# Patient Record
Sex: Female | Born: 1980 | Race: White | Hispanic: No | Marital: Married | Smoking: Never smoker
Health system: Southern US, Community
[De-identification: ages and names within clinical notes are randomized; demographics above are authoritative.]

## PROBLEM LIST (undated history)

## (undated) DIAGNOSIS — D509 Iron deficiency anemia, unspecified: Principal | ICD-10-CM

## (undated) DIAGNOSIS — D649 Anemia, unspecified: Secondary | ICD-10-CM

## (undated) DIAGNOSIS — Z9884 Bariatric surgery status: Secondary | ICD-10-CM

## (undated) DIAGNOSIS — E559 Vitamin D deficiency, unspecified: Secondary | ICD-10-CM

## (undated) DIAGNOSIS — Z5189 Encounter for other specified aftercare: Secondary | ICD-10-CM

## (undated) HISTORY — PX: GASTRIC BYPASS: SHX52

## (undated) HISTORY — DX: Vitamin D deficiency, unspecified: E55.9

## (undated) HISTORY — PX: APPENDECTOMY: SHX54

## (undated) HISTORY — DX: Anemia, unspecified: D64.9

## (undated) HISTORY — DX: Bariatric surgery status: Z98.84

## (undated) HISTORY — PX: ABDOMINAL HYSTERECTOMY: SHX81

## (undated) HISTORY — DX: Iron deficiency anemia, unspecified: D50.9

## (undated) HISTORY — DX: Encounter for other specified aftercare: Z51.89

## (undated) HISTORY — PX: CHOLECYSTECTOMY: SHX55

---

## 2013-07-23 ENCOUNTER — Ambulatory Visit (INDEPENDENT_AMBULATORY_CARE_PROVIDER_SITE_OTHER): Payer: BC Managed Care – PPO | Admitting: Family Medicine

## 2013-07-23 VITALS — BP 118/72 | HR 65 | Temp 97.8°F | Resp 16 | Ht 66.0 in | Wt 145.0 lb

## 2013-07-23 DIAGNOSIS — R5381 Other malaise: Secondary | ICD-10-CM

## 2013-07-23 DIAGNOSIS — R252 Cramp and spasm: Secondary | ICD-10-CM

## 2013-07-23 DIAGNOSIS — R5383 Other fatigue: Secondary | ICD-10-CM

## 2013-07-23 DIAGNOSIS — R209 Unspecified disturbances of skin sensation: Secondary | ICD-10-CM

## 2013-07-23 DIAGNOSIS — Z862 Personal history of diseases of the blood and blood-forming organs and certain disorders involving the immune mechanism: Secondary | ICD-10-CM

## 2013-07-23 LAB — CBC WITH DIFFERENTIAL/PLATELET
Basophils Absolute: 0 10*3/uL (ref 0.0–0.1)
Eosinophils Absolute: 0 10*3/uL (ref 0.0–0.7)
HCT: 39 % (ref 36.0–46.0)
Hemoglobin: 12.1 g/dL (ref 12.0–15.0)
Lymphocytes Relative: 25 % (ref 12–46)
Lymphs Abs: 2.1 10*3/uL (ref 0.7–4.0)
MCH: 25.9 pg — ABNORMAL LOW (ref 26.0–34.0)
MCHC: 31.1 g/dL (ref 30.0–36.0)
MCV: 83.6 fL (ref 78.0–100.0)
Monocytes Absolute: 0.4 10*3/uL (ref 0.1–1.0)
Monocytes Relative: 5 % (ref 3–12)
Neutro Abs: 5.7 10*3/uL (ref 1.7–7.7)
Neutrophils Relative %: 70 % (ref 43–77)
Platelets: 246 10*3/uL (ref 150–400)
RBC: 4.67 MIL/uL (ref 3.87–5.11)
RDW: 15.1 % (ref 11.5–15.5)
WBC: 8.2 10*3/uL (ref 4.0–10.5)

## 2013-07-23 MED ORDER — OXYCODONE HCL 5 MG PO TABS: 5.0000 mg | ORAL_TABLET | Freq: Four times a day (QID) | ORAL | Status: DC | PRN

## 2013-07-23 NOTE — Progress Notes (Signed)
   Subjective:    Patient ID: Cindy Hodge, female    DOB: 06/23/1980, 33 y.o.   MRN: [id number]  HPI  33 year old female presents for evaluation of anemia. Hx of anemia in pregnancy diagnosed at prenatal visit with a Hgb of 3.8.     itchiness in feet Fatigue Color decreasing, lethargic  Was taking iron supplements for 2 months that did not help. Had infusions for the remainder 3 months postpartem supposed to f/u with hematology Heavy period postpartem On menses now    Review of Systems  Constitutional: Positive for fatigue. Negative for fever and chills.  Cardiovascular: Negative for chest pain.  Gastrointestinal: Negative for nausea and vomiting.  Musculoskeletal: Negative for myalgias (+muscle cramps).  Neurological: Positive for weakness.       Objective:   Physical Exam  Constitutional: She is oriented to person, place, and time. She appears well-developed and well-nourished.  HENT:  Head: Normocephalic and atraumatic.  Right Ear: External ear normal.  Left Ear: External ear normal.  Eyes: Conjunctivae are normal.  Neck: Normal range of motion. Neck supple.  Cardiovascular: Normal rate, regular rhythm and normal heart sounds.   Pulmonary/Chest: Effort normal and breath sounds normal.  Abdominal: There is no hepatosplenomegaly. There is no tenderness. There is no rigidity and no guarding.  Lymphadenopathy:    She has no cervical adenopathy.  Neurological: She is alert and oriented to person, place, and time.  Psychiatric: She has a normal mood and affect. Her behavior is normal. Judgment and thought content normal.      Results for orders placed in visit on 07/23/13  CBC WITH DIFFERENTIAL      Result Value Ref Range   WBC 8.2  4.0 - 10.5 K/uL   RBC 4.67  3.87 - 5.11 MIL/uL   Hemoglobin 12.1  12.0 - 15.0 g/dL   HCT 39.0  36.0 - 46.0 %   MCV 83.6  78.0 - 100.0 fL   MCH 25.9 (*) 26.0 - 34.0 pg   MCHC 31.1  30.0 - 36.0 g/dL   RDW 15.1  11.5 - 15.5 %   Platelets 246  150 - 400 K/uL   Neutrophils Relative % 70  43 - 77 %   Neutro Abs 5.7  1.7 - 7.7 K/uL   Lymphocytes Relative 25  12 - 46 %   Lymphs Abs 2.1  0.7 - 4.0 K/uL   Monocytes Relative 5  3 - 12 %   Monocytes Absolute 0.4  0.1 - 1.0 K/uL   Eosinophils Absolute 0.0  0.0 - 0.7 K/uL   Basophils Absolute 0.0  0.0 - 0.1 K/uL   Smear Review Criteria for review not met         Assessment & Plan:  History of anemia - Plan: CBC with Differential, Ferritin, Iron and TIBC, Ambulatory referral to Hematology  Other fatigue  Disturbance of skin sensation  Muscle cramps - Plan: oxyCODONE (OXY IR/ROXICODONE) 5 MG immediate release tablet  Stat CBC normal which is reassuring. Iron studies pending. Will add TSH and CMET if possible.  Small quantity of oxycodone 5 mg tablets provided for pain/cramping. Per patient she cannot tolerate acetaminophen. RTC precautions discussed. Proceed with hematology evaluation. Follow up will be based on remainder of labs.

## 2013-07-24 LAB — COMPREHENSIVE METABOLIC PANEL
ALT: 58 U/L — ABNORMAL HIGH (ref 0–35)
AST: 46 U/L — ABNORMAL HIGH (ref 0–37)
Albumin: 4.5 g/dL (ref 3.5–5.2)
Alkaline Phosphatase: 103 U/L (ref 39–117)
BUN: 12 mg/dL (ref 6–23)
CO2: 20 mEq/L (ref 19–32)
Calcium: 9 mg/dL (ref 8.4–10.5)
Chloride: 107 mEq/L (ref 96–112)
Creat: 0.61 mg/dL (ref 0.50–1.10)
Glucose, Bld: 82 mg/dL (ref 70–99)
Potassium: 4.1 mEq/L (ref 3.5–5.3)
Sodium: 139 mEq/L (ref 135–145)
Total Bilirubin: 0.4 mg/dL (ref 0.2–1.2)
Total Protein: 7 g/dL (ref 6.0–8.3)

## 2013-07-24 LAB — IRON AND TIBC
%SAT: 7 % — ABNORMAL LOW (ref 20–55)
Iron: 33 ug/dL — ABNORMAL LOW (ref 42–145)
TIBC: 445 ug/dL (ref 250–470)
UIBC: 412 ug/dL — ABNORMAL HIGH (ref 125–400)

## 2013-07-24 LAB — TSH: TSH: 1.395 u[IU]/mL (ref 0.350–4.500)

## 2013-07-24 LAB — FERRITIN: Ferritin: 5 ng/mL — ABNORMAL LOW (ref 10–291)

## 2013-07-28 ENCOUNTER — Telehealth: Payer: Self-pay | Admitting: Hematology and Oncology

## 2013-07-28 NOTE — Telephone Encounter (Signed)
PATIENT CALLED TO CONFIRMED NP APPT ON 07/14 @ 1:45.  REFERRING DR. HEATHER MARTE DX- HX OF ANEMIA

## 2013-08-05 ENCOUNTER — Telehealth: Payer: Self-pay | Admitting: Hematology and Oncology

## 2013-08-05 ENCOUNTER — Ambulatory Visit (HOSPITAL_BASED_OUTPATIENT_CLINIC_OR_DEPARTMENT_OTHER): Payer: BC Managed Care – PPO | Admitting: Hematology and Oncology

## 2013-08-05 ENCOUNTER — Encounter: Payer: Self-pay | Admitting: Hematology and Oncology

## 2013-08-05 ENCOUNTER — Ambulatory Visit (HOSPITAL_BASED_OUTPATIENT_CLINIC_OR_DEPARTMENT_OTHER): Payer: BC Managed Care – PPO

## 2013-08-05 VITALS — BP 131/68 | HR 61 | Temp 98.4°F | Resp 18 | Ht 66.0 in | Wt 144.2 lb

## 2013-08-05 DIAGNOSIS — E559 Vitamin D deficiency, unspecified: Secondary | ICD-10-CM

## 2013-08-05 DIAGNOSIS — D509 Iron deficiency anemia, unspecified: Secondary | ICD-10-CM

## 2013-08-05 DIAGNOSIS — Z9884 Bariatric surgery status: Secondary | ICD-10-CM

## 2013-08-05 HISTORY — DX: Vitamin D deficiency, unspecified: E55.9

## 2013-08-05 HISTORY — DX: Bariatric surgery status: Z98.84

## 2013-08-05 HISTORY — DX: Iron deficiency anemia, unspecified: D50.9

## 2013-08-05 LAB — CBC & DIFF AND RETIC
BASO%: 0.4 % (ref 0.0–2.0)
BASOS ABS: 0 10*3/uL (ref 0.0–0.1)
EOS ABS: 0.1 10*3/uL (ref 0.0–0.5)
EOS%: 1.1 % (ref 0.0–7.0)
HCT: 39.3 % (ref 34.8–46.6)
HEMOGLOBIN: 12.4 g/dL (ref 11.6–15.9)
IMMATURE RETIC FRACT: 9.1 % (ref 1.60–10.00)
LYMPH%: 37.8 % (ref 14.0–49.7)
MCH: 26.2 pg (ref 25.1–34.0)
MCHC: 31.6 g/dL (ref 31.5–36.0)
MCV: 82.9 fL (ref 79.5–101.0)
MONO#: 0.3 10*3/uL (ref 0.1–0.9)
MONO%: 6.7 % (ref 0.0–14.0)
NEUT%: 54 % (ref 38.4–76.8)
NEUTROS ABS: 2.6 10*3/uL (ref 1.5–6.5)
Platelets: 284 10*3/uL (ref 145–400)
RBC: 4.74 10*6/uL (ref 3.70–5.45)
RDW: 14.8 % — AB (ref 11.2–14.5)
RETIC %: 0.78 % (ref 0.70–2.10)
RETIC CT ABS: 36.97 10*3/uL (ref 33.70–90.70)
WBC: 4.8 10*3/uL (ref 3.9–10.3)
lymph#: 1.8 10*3/uL (ref 0.9–3.3)

## 2013-08-05 LAB — IRON AND TIBC CHCC
%SAT: 30 % (ref 21–57)
IRON: 118 ug/dL (ref 41–142)
TIBC: 398 ug/dL (ref 236–444)
UIBC: 280 ug/dL (ref 120–384)

## 2013-08-05 LAB — FERRITIN CHCC: FERRITIN: 8 ng/mL — AB (ref 9–269)

## 2013-08-05 NOTE — Progress Notes (Signed)
[facility] CONSULT NOTE  Patient Care Team: No Pcp Per Patient as PCP - General (General Practice)  CHIEF COMPLAINTS/PURPOSE OF CONSULTATION:  Severe iron deficiency anemia  HISTORY OF PRESENTING ILLNESS:  Cindy Hodge 33 y.o. female is here because of recent diagnosis of severe iron deficiency anemia.  She was found to have abnormal CBC the complaining of shortness of breath. She was pregnant at that time with her first shot was found to have a hemoglobin of 3.8. He received somewhere between 4-6 units of blood to support her through pregnancy. The patient history of gastric bypass in 2003 and had lost 150 pounds. She was pregnant last year for the first time and was given blood transfusion. She nursed her baby for one month. Her son is currently 6 months ago. She was a regular blood donor when she was younger. The patient had a history of chronic menorrhagia in the past.  She denies recent chest pain on exertion, shortness of breath on minimal exertion, pre-syncopal episodes, or palpitations. She complained of profound fatigue. She had not noticed any recent bleeding such as epistaxis, hematuria or hematochezia The patient denies over the counter NSAID ingestion. She is not on antiplatelets agents.  She had no prior history or diagnosis of cancer. Her age appropriate screening programs are up-to-date. She complain of excessive pica with chewing ice. She eats a variety of diet.  The patient was prescribed oral iron supplements and she takes one daily.  MEDICAL HISTORY:  Past Medical History  Diagnosis Date  . Anemia   . Blood transfusion without reported diagnosis   . Iron deficiency anemia, unspecified 08/05/2013  . 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 Unspecified vitamin D deficiency 08/05/2013    SURGICAL HISTORY: Past Surgical History  Procedure Laterality Date  . Appendectomy    . Cholecystectomy    . Gastric bypass      SOCIAL HISTORY: History    Social History  . Marital Status: Married    Spouse Name: N/A    Number of Children: N/A  . Years of Education: N/A   Occupational History  . Not on file.   Social History Main Topics  . Smoking status: Never Smoker   . Smokeless tobacco: Not on file  . Alcohol Use: No  . Drug Use: Not on file  . Sexual Activity: Not on file   Other Topics Concern  . Not on file   Social History Narrative  . No narrative on file    FAMILY HISTORY: No family history on file.  ALLERGIES:  is allergic to acetaminophen.  MEDICATIONS:  Current Outpatient Prescriptions  Medication Sig Dispense Refill  . Ferrous Sulfate (IRON) 325 (65 FE) MG TABS Take by mouth daily.      . Multiple Vitamin (MULTIVITAMIN) capsule Take 1 capsule by mouth daily.      Marland Kitchen oxyCODONE (OXY IR/ROXICODONE) 5 MG immediate release tablet Take 1 tablet (5 mg total) by mouth every 6 (six) hours as needed for severe pain.  20 tablet  0   No current facility-administered medications for this visit.    REVIEW OF SYSTEMS:   Constitutional: Denies fevers, chills or abnormal night sweats Eyes: Denies blurriness of vision, double vision or watery eyes Ears, nose, mouth, throat, and face: Denies mucositis or sore throat Respiratory: Denies cough, dyspnea or wheezes Cardiovascular: Denies palpitation, chest discomfort or lower extremity swelling Gastrointestinal:  Denies nausea, heartburn or change in bowel habits Skin: Denies abnormal skin rashes Lymphatics: Denies  new lymphadenopathy or easy bruising Neurological:Denies numbness, tingling or new weaknesses Behavioral/Psych: Mood is stable, no new changes  All other systems were reviewed with the patient and are negative.  PHYSICAL EXAMINATION: ECOG PERFORMANCE STATUS: 0 - Asymptomatic  Filed Vitals:   08/05/13 1008  BP: 131/68  Pulse: 61  Temp: 98.4 F (36.9 C)  Resp: 18   Filed Weights   08/05/13 1008  Weight: 144 lb 3.2 oz (65.409 kg)    GENERAL:alert, no  distress and comfortable SKIN: skin color, texture, turgor are normal, no rashes or significant lesions EYES: normal, conjunctiva are pink and non-injected, sclera clear OROPHARYNX:no exudate, no erythema and lips, buccal mucosa, and tongue normal  NECK: supple, thyroid normal size, non-tender, without nodularity LYMPH:  no palpable lymphadenopathy in the cervical, axillary or inguinal LUNGS: clear to auscultation and percussion with normal breathing effort HEART: regular rate & rhythm and no murmurs and no lower extremity edema ABDOMEN:abdomen soft, non-tender and normal bowel sounds Musculoskeletal:no cyanosis of digits and no clubbing  PSYCH: alert & oriented x 3 with fluent speech NEURO: no focal motor/sensory deficits  LABORATORY DATA:  I have reviewed the data as listed Recent Results (from the past 2160 hour(s))  CBC WITH DIFFERENTIAL     Status: Abnormal   Collection Time    07/23/13  3:23 PM      Result Value Ref Range   WBC 8.2  4.0 - 10.5 K/uL   RBC 4.67  3.87 - 5.11 MIL/uL   Hemoglobin 12.1  12.0 - 15.0 g/dL   HCT 39.0  36.0 - 46.0 %   MCV 83.6  78.0 - 100.0 fL   MCH 25.9 (*) 26.0 - 34.0 pg   MCHC 31.1  30.0 - 36.0 g/dL   RDW 15.1  11.5 - 15.5 %   Platelets 246  150 - 400 K/uL   Neutrophils Relative % 70  43 - 77 %   Neutro Abs 5.7  1.7 - 7.7 K/uL   Lymphocytes Relative 25  12 - 46 %   Lymphs Abs 2.1  0.7 - 4.0 K/uL   Monocytes Relative 5  3 - 12 %   Monocytes Absolute 0.4  0.1 - 1.0 K/uL   Eosinophils Absolute 0.0  0.0 - 0.7 K/uL   Basophils Absolute 0.0  0.0 - 0.1 K/uL   Smear Review Criteria for review not met    FERRITIN     Status: Abnormal   Collection Time    07/23/13  3:24 PM      Result Value Ref Range   Ferritin 5 (*) 10 - 291 ng/mL  IRON AND TIBC     Status: Abnormal   Collection Time    07/23/13  3:24 PM      Result Value Ref Range   Iron 33 (*) 42 - 145 ug/dL   UIBC 412 (*) 125 - 400 ug/dL   TIBC 445  250 - 470 ug/dL   %SAT 7 (*) 20 - 55 %   COMPREHENSIVE METABOLIC PANEL     Status: Abnormal   Collection Time    07/23/13  6:01 PM      Result Value Ref Range   Sodium 139  135 - 145 mEq/L   Potassium 4.1  3.5 - 5.3 mEq/L   Chloride 107  96 - 112 mEq/L   CO2 20  19 - 32 mEq/L   Glucose, Bld 82  70 - 99 mg/dL   BUN 12  6 - 23 mg/dL  Creat 0.61  0.50 - 1.10 mg/dL   Total Bilirubin 0.4  0.2 - 1.2 mg/dL   Alkaline Phosphatase 103  39 - 117 U/L   AST 46 (*) 0 - 37 U/L   ALT 58 (*) 0 - 35 U/L   Total Protein 7.0  6.0 - 8.3 g/dL   Albumin 4.5  3.5 - 5.2 g/dL   Calcium 9.0  8.4 - 10.5 mg/dL  TSH     Status: None   Collection Time    07/23/13  6:01 PM      Result Value Ref Range   TSH 1.395  0.350 - 4.500 uIU/mL  CBC & DIFF AND RETIC     Status: Abnormal   Collection Time    08/05/13 10:47 AM      Result Value Ref Range   WBC 4.8  3.9 - 10.3 10e3/uL   NEUT# 2.6  1.5 - 6.5 10e3/uL   HGB 12.4  11.6 - 15.9 g/dL   HCT 39.3  34.8 - 46.6 %   Platelets 284  145 - 400 10e3/uL   MCV 82.9  79.5 - 101.0 fL   MCH 26.2  25.1 - 34.0 pg   MCHC 31.6  31.5 - 36.0 g/dL   RBC 4.74  3.70 - 5.45 10e6/uL   RDW 14.8 (*) 11.2 - 14.5 %   lymph# 1.8  0.9 - 3.3 10e3/uL   MONO# 0.3  0.1 - 0.9 10e3/uL   Eosinophils Absolute 0.1  0.0 - 0.5 10e3/uL   Basophils Absolute 0.0  0.0 - 0.1 10e3/uL   NEUT% 54.0  38.4 - 76.8 %   LYMPH% 37.8  14.0 - 49.7 %   MONO% 6.7  0.0 - 14.0 %   EOS% 1.1  0.0 - 7.0 %   BASO% 0.4  0.0 - 2.0 %   Retic % 0.78  0.70 - 2.10 %   Retic Ct Abs 36.97  33.70 - 90.70 10e3/uL   Immature Retic Fract 9.10  1.60 - 10.00 %  IRON AND TIBC [facility]     Status: None   Collection Time    08/05/13 10:47 AM      Result Value Ref Range   Iron 118  41 - 142 ug/dL   TIBC 398  236 - 444 ug/dL   UIBC 280  120 - 384 ug/dL   %SAT 30  21 - 57 %  FERRITIN [facility]     Status: Abnormal   Collection Time    08/05/13 10:47 AM      Result Value Ref Range   Ferritin 8 (*) 9 - 269 ng/ml  VITAMIN B12     Status: None   Collection Time     08/05/13 10:48 AM      Result Value Ref Range   Vitamin B-12 715  211 - 911 pg/mL   ASSESSMENT & PLAN:  Iron deficiency anemia, unspecified There is no doubt this could be related to severe mineral malabsorption. I recommend intravenous iron infusion if repeat blood work suggests severe iron deficiency anemia. In the meantime, she continues taking oral iron supplements. I will check also serum vitamin B12 level due do possible malabsorption of vitamin B12.  Unspecified vitamin D deficiency I recommend she takes 1000 units of vitamin D daily due to potential malabsorption of vitamin D. I will check a vitamin D level and if this very low, I prescribed high dose replacement therapy.      All questions were answered. The patient knows  to call the clinic with any problems, questions or concerns. I spent 30 mins counseling the patient face to face. The total time spent in the appointment was 45 mins and more than 50% was on counseling.     [facility] [place], [place], MD 08/05/2013 9:28 PM

## 2013-08-05 NOTE — Telephone Encounter (Signed)
gv adn printed aptp sched adna vs for pt for Jan 2016....sent pt to lab

## 2013-08-05 NOTE — Assessment & Plan Note (Addendum)
There is no doubt this could be related to severe mineral malabsorption. I recommend intravenous iron infusion if repeat blood work suggests severe iron deficiency anemia. In the meantime, she continues taking oral iron supplements. I will check also serum vitamin B12 level due do possible malabsorption of vitamin B12.

## 2013-08-05 NOTE — Telephone Encounter (Signed)
gv and printed appt sched and avs for pt for Jan 2016...sent pt to lab °

## 2013-08-05 NOTE — Assessment & Plan Note (Signed)
I recommend she takes 1000 units of vitamin D daily due to potential malabsorption of vitamin D. I will check a vitamin D level and if this very low, I prescribed high dose replacement therapy.

## 2013-08-06 ENCOUNTER — Telehealth: Payer: Self-pay | Admitting: Hematology and Oncology

## 2013-08-06 LAB — VITAMIN B12: Vitamin B-12: 715 pg/mL (ref 211–911)

## 2013-08-06 LAB — VITAMIN D 25 HYDROXY (VIT D DEFICIENCY, FRACTURES): Vit D, 25-Hydroxy: 59 ng/mL (ref 30–89)

## 2013-08-06 NOTE — Telephone Encounter (Signed)
I reviewed her blood test results. Her iron studies have improved, suggested that she is absorbing the iron supplement. Serum vitamin D and vitamin B 12 are within normal limits. The patient does not need intravenous iron. I recommend she continues oral iron supplement and recheck in 6 months.

## 2013-11-18 ENCOUNTER — Encounter: Payer: Self-pay | Admitting: Family Medicine

## 2013-11-18 ENCOUNTER — Ambulatory Visit (INDEPENDENT_AMBULATORY_CARE_PROVIDER_SITE_OTHER): Payer: BC Managed Care – PPO | Admitting: Family Medicine

## 2013-11-18 VITALS — BP 121/71 | HR 72 | Temp 98.1°F | Resp 16 | Ht 66.5 in | Wt 146.0 lb

## 2013-11-18 DIAGNOSIS — R350 Frequency of micturition: Secondary | ICD-10-CM

## 2013-11-18 DIAGNOSIS — J069 Acute upper respiratory infection, unspecified: Secondary | ICD-10-CM

## 2013-11-18 DIAGNOSIS — O2311 Infections of bladder in pregnancy, first trimester: Secondary | ICD-10-CM

## 2013-11-18 LAB — POCT URINALYSIS DIPSTICK
Bilirubin, UA: NEGATIVE
GLUCOSE UA: NEGATIVE
Leukocytes, UA: NEGATIVE
Nitrite, UA: POSITIVE
Protein, UA: 30
SPEC GRAV UA: 1.015
Urobilinogen, UA: 0.2
pH, UA: 6

## 2013-11-18 LAB — POCT UA - MICROSCOPIC ONLY
Casts, Ur, LPF, POC: NEGATIVE
Crystals, Ur, HPF, POC: NEGATIVE
MUCUS UA: POSITIVE
Yeast, UA: NEGATIVE

## 2013-11-18 MED ORDER — AMOXICILLIN 875 MG PO TABS: 875.0000 mg | ORAL_TABLET | Freq: Two times a day (BID) | ORAL | Status: DC

## 2013-11-18 MED ORDER — NITROFURANTOIN MONOHYD MACRO 100 MG PO CAPS: 100.0000 mg | ORAL_CAPSULE | Freq: Two times a day (BID) | ORAL | Status: DC

## 2013-11-18 MED ORDER — ALBUTEROL SULFATE HFA 108 (90 BASE) MCG/ACT IN AERS: 2.0000 | INHALATION_SPRAY | RESPIRATORY_TRACT | Status: AC | PRN

## 2013-11-18 NOTE — Patient Instructions (Addendum)
Increase fluids Mucinex to thin secretions  Medicines During Pregnancy During pregnancy, there are medicines that are either safe or unsafe to take. Medicines include prescriptions from your caregiver, over-the-counter medicines, topical creams applied to the skin, and all herbal substances. Medicines are put into either Class A, B, C, or D. Class A and B medicines have been shown to be safe in pregnancy. Class C medicines are also considered to be safe in pregnancy, but these medicines should only be used when necessary. Class D medicines should not be used at all in pregnancy. They can be harmful to a baby.  It is best to take as little medicine as possible while pregnant. However, some medicines are necessary to take for the mother and baby's health. Sometimes, it is more dangerous to stop taking certain medicines than to stay on them. This is often the case for people with long-term (chronic) conditions such as asthma, diabetes, or high blood pressure (hypertension). If you are pregnant and have a chronic illness, call your caregiver right away. Bring a list of your medicines and their doses to your appointments. If you are planning to become pregnant, schedule a doctor's appointment and discuss your medicines with your caregiver. Lastly, write down the phone number to your pharmacist. They can answer questions regarding a medicine's class and safety. They cannot give advice as to whether you should or should not be on a medicine.  SAFE AND UNSAFE MEDICINES There is a long list of medicines that are considered safe in pregnancy. Below is a shorter list. For specific medicines, ask your caregiver.  AllergyMedicines Loratadine, cetirizine, and chlorpheniramine are safe to take. Certain nasal steroid sprays are safe. Talk to your caregiver about specific brands that are safe. Analgesics Acetaminophen and acetaminophen with codeine are safe to take. All other nonsteroidal anti-inflammatory drugs  (NSAIDS) are not safe. This includes ibuprofen.  Antacids Many over-the-counter antacids are safe to take. Talk to your caregiver about specific brands that are safe. Famotidine, ranitidine, and lansoprazole are safe. Omepresole is considered safe to take in the second trimester. Antibiotic Medicines There are several antibiotics to avoid. These include, but are not limited to, tetracyline, quinolones, and sulfa medications. Talk to your caregiver before taking any antibiotic.  Antihistamines Talk to your caregiver about specific brands that are safe.  Asthma Medicines Most asthma steroid inhalers are safe to take. Talk to your caregiver for specific details. Calcium Calcium supplements are safe to take. Do not take oyster shell calcium.  Cough and Cold Medicines It is safe to take products with guaifenesin or dextromethorphan. Talk to your caregiver about specific brands that are safe. It is not safe to take products that contain aspirin or ibuprofen. Decongestant Medicines Pseudoephedrine-containing products are safe to take in the second and third trimester.  Depression Medicines Talk about these medicines with your caregiver.  Antidiarrheal Medicines It is safe to take loperamide. Talk to your caregiver about specific brands that are safe. It is not safe to take any antidiarrheal medicine that contains bismuth. Eyedrops Allergy eyedrops should be limited.  Iron It is safe to use certain iron-containing medicines for anemia in pregnancy. They require a prescription.  Antinausea Medicines It is safe to take doxylamine and vitamin B6 as directed. There are other prescription medicines available, if needed.  Sleep aids It is safe to take diphenhydramine and acetaminophen with diphenhydramine.  Steroids Hydrocortisone creams are safe to use as directed. Oral steroids require a prescription. It is not safe to take any  hemorrhoid cream with pramoxine or phenylephrine. Stool  softener It is safe to take stool softener medicines. Avoid daily or prolonged use of stool softeners. Thyroid Medicine It is important to stay on this thyroid medicine. It needs to be followed by your caregiver.  Vaginal Medicines Your caregiver will prescribe a medicine to you if you have a vaginal infection. Certain antifungal medicines are safe to use if you have a sexually transmitted infection (STI). Talk to your caregiver.  Document Released: 01/02/2005 Document Revised: 03/27/2011 Document Reviewed: 01/03/2011 [facility] Patient Information 2015 [place], [place]. This information is not intended to replace advice given to you by your health care provider. Make sure you discuss any questions you have with your health care provider.

## 2013-11-20 NOTE — Progress Notes (Signed)
Subjective:    Patient ID: Cindy Hodge, female    DOB: Aug 05, 1980, 33 y.o.   MRN: [id number]  HPI This is a very pleasant 33 yo female who is about 2 months pregnant with her second child. She has her 9210 month old son in with her today.   LMP- 09/17/2013, EDD- 06/25/14. Has OB appointment next month.   The patient presents today with urinary pressure, burning, frequency x 2 days. No relief with cranberry pills. Blood on tissue. Has had occasional cystitis in the past, usually resolved with cranberry pills and fluids. She has had pyelo x 1 several years ago.   Ear pain and pressure for several weeks, sore throat, intermittent cough with dark mucous with showering in the morning, clear nasal drainage. Took nyquill once with some help with sleep. Son has just gotten over an ear infection. She is unsure what she can safely take during pregnancy. Feels fatigued- not sure if this is due to pregnancy or illness.  No asthma history, nonsmoker.   Review of Systems No fever/chills, no back pain, no vaginal symptoms, no nausea, no vomiting, no abdominal pain. Feels winded, no SOB, chest feels tight with cough, fatigue.    Objective:   Physical Exam  Constitutional: She is oriented to person, place, and time. She appears well-developed and well-nourished.  HENT:  Head: Normocephalic and atraumatic.  Right Ear: Tympanic membrane, external ear and ear canal normal.  Left Ear: Tympanic membrane, external ear and ear canal normal.  Nose: Mucosal edema and rhinorrhea present. Right sinus exhibits no maxillary sinus tenderness and no frontal sinus tenderness. Left sinus exhibits no maxillary sinus tenderness and no frontal sinus tenderness.  Mouth/Throat: Oropharynx is clear and moist. No oropharyngeal exudate.  Eyes: Conjunctivae are normal.  Neck: Normal range of motion. Neck supple.  Cardiovascular: Normal rate, regular rhythm and normal heart sounds.   Pulmonary/Chest: Effort normal and breath  sounds normal.  Abdominal: There is no CVA tenderness.  Musculoskeletal: Normal range of motion.  Lymphadenopathy:    She has no cervical adenopathy.  Neurological: She is alert and oriented to person, place, and time.  Skin: Skin is warm and dry.  Psychiatric: She has a normal mood and affect. Her behavior is normal. Judgment and thought content normal.  Vitals reviewed.     Assessment & Plan:  1. Urinary frequency - POCT UA - Microscopic Only - POCT urinalysis dipstick - Urine culture  2. Cystitis during pregnancy in first trimester, antepartum - nitrofurantoin, macrocrystal-monohydrate, (MACROBID) 100 MG capsule; Take 1 capsule (100 mg total) by mouth 2 (two) times daily.  Dispense: 14 capsule; Refill: 0 -Patient instructed to return to clinic if fever/chills, nausea/vomiting, increased pain.  3. Acute upper respiratory infection -provided patient with list of medications that are safe to take during pregnancy and encouraged her to treat symptoms aggressively for the next several days to see if she has improvement. If not, I have provided her with a printed prescription for amoxicillin -Discussed use of medications during pregnancy and importance of weighing the risks and benefits - amoxicillin (AMOXIL) 875 MG tablet; Take 1 tablet (875 mg total) by mouth 2 (two) times daily.  Dispense: 20 tablet; Refill: 0 - albuterol (PROVENTIL HFA;VENTOLIN HFA) 108 (90 BASE) MCG/ACT inhaler; Inhale 2 puffs into the lungs every 4 (four) hours as needed for wheezing or shortness of breath (cough, shortness of breath or wheezing.).  Dispense: 1 Inhaler; Refill: 1   Emi Belfasteborah B. Laaibah Wartman, FNP-BC  [facility]  11/20/2013 11:41 AM

## 2013-11-21 LAB — URINE CULTURE: Colony Count: >=100000

## 2013-12-29 ENCOUNTER — Other Ambulatory Visit: Payer: Self-pay | Admitting: Family Medicine

## 2014-01-23 ENCOUNTER — Telehealth: Payer: Self-pay | Admitting: Hematology and Oncology

## 2014-01-23 NOTE — Telephone Encounter (Signed)
returned pt call and cx appt per pt at another facility

## 2014-01-27 ENCOUNTER — Other Ambulatory Visit: Payer: BC Managed Care – PPO

## 2014-02-03 ENCOUNTER — Ambulatory Visit: Payer: BC Managed Care – PPO | Admitting: Hematology and Oncology

## 2015-05-30 ENCOUNTER — Encounter (HOSPITAL_BASED_OUTPATIENT_CLINIC_OR_DEPARTMENT_OTHER): Payer: Self-pay | Admitting: Emergency Medicine

## 2015-05-30 ENCOUNTER — Emergency Department (HOSPITAL_BASED_OUTPATIENT_CLINIC_OR_DEPARTMENT_OTHER): Payer: BLUE CROSS/BLUE SHIELD

## 2015-05-30 ENCOUNTER — Emergency Department (HOSPITAL_BASED_OUTPATIENT_CLINIC_OR_DEPARTMENT_OTHER)
Admission: EM | Admit: 2015-05-30 | Discharge: 2015-05-30 | Disposition: A | Discharge status: Home / Self Care | Payer: BLUE CROSS/BLUE SHIELD | Attending: Emergency Medicine | Admitting: Emergency Medicine

## 2015-05-30 DIAGNOSIS — R1031 Right lower quadrant pain: Secondary | ICD-10-CM | POA: Diagnosis not present

## 2015-05-30 DIAGNOSIS — Z79899 Other long term (current) drug therapy: Secondary | ICD-10-CM | POA: Diagnosis not present

## 2015-05-30 DIAGNOSIS — N12 Tubulo-interstitial nephritis, not specified as acute or chronic: Secondary | ICD-10-CM | POA: Insufficient documentation

## 2015-05-30 DIAGNOSIS — M545 Low back pain: Secondary | ICD-10-CM | POA: Diagnosis present

## 2015-05-30 LAB — COMPREHENSIVE METABOLIC PANEL
ALT: 23 U/L (ref 14–54)
ANION GAP: 7 (ref 5–15)
AST: 22 U/L (ref 15–41)
Albumin: 3.6 g/dL (ref 3.5–5.0)
Alkaline Phosphatase: 67 U/L (ref 38–126)
BUN: 9 mg/dL (ref 6–20)
CHLORIDE: 104 mmol/L (ref 101–111)
CO2: 25 mmol/L (ref 22–32)
Calcium: 8.6 mg/dL — ABNORMAL LOW (ref 8.9–10.3)
Creatinine, Ser: 0.51 mg/dL (ref 0.44–1.00)
Glucose, Bld: 91 mg/dL (ref 65–99)
POTASSIUM: 4 mmol/L (ref 3.5–5.1)
Sodium: 136 mmol/L (ref 135–145)
Total Bilirubin: 0.4 mg/dL (ref 0.3–1.2)
Total Protein: 7.1 g/dL (ref 6.5–8.1)

## 2015-05-30 LAB — LIPASE, BLOOD: LIPASE: 15 U/L (ref 11–51)

## 2015-05-30 LAB — CBC WITH DIFFERENTIAL/PLATELET
BASOS ABS: 0 10*3/uL (ref 0.0–0.1)
Basophils Relative: 0 %
EOS PCT: 1 %
Eosinophils Absolute: 0.1 10*3/uL (ref 0.0–0.7)
HCT: 32.9 % — ABNORMAL LOW (ref 36.0–46.0)
Hemoglobin: 10.6 g/dL — ABNORMAL LOW (ref 12.0–15.0)
LYMPHS ABS: 1.4 10*3/uL (ref 0.7–4.0)
LYMPHS PCT: 22 %
MCH: 25.7 pg — AB (ref 26.0–34.0)
MCHC: 32.2 g/dL (ref 30.0–36.0)
MCV: 79.7 fL (ref 78.0–100.0)
MONO ABS: 0.5 10*3/uL (ref 0.1–1.0)
Monocytes Relative: 8 %
Neutro Abs: 4.2 10*3/uL (ref 1.7–7.7)
Neutrophils Relative %: 69 %
PLATELETS: 318 10*3/uL (ref 150–400)
RBC: 4.13 MIL/uL (ref 3.87–5.11)
RDW: 18.4 % — AB (ref 11.5–15.5)
WBC: 6.1 10*3/uL (ref 4.0–10.5)

## 2015-05-30 LAB — URINALYSIS, ROUTINE W REFLEX MICROSCOPIC
Bilirubin Urine: NEGATIVE
GLUCOSE, UA: NEGATIVE mg/dL
Hgb urine dipstick: NEGATIVE
KETONES UR: NEGATIVE mg/dL
NITRITE: POSITIVE — AB
Protein, ur: NEGATIVE mg/dL
Specific Gravity, Urine: 1.021 (ref 1.005–1.030)
pH: 6 (ref 5.0–8.0)

## 2015-05-30 LAB — URINE MICROSCOPIC-ADD ON

## 2015-05-30 LAB — PREGNANCY, URINE: PREG TEST UR: NEGATIVE

## 2015-05-30 MED ORDER — MORPHINE SULFATE (PF) 4 MG/ML IV SOLN
4.0000 mg | Freq: Once | INTRAVENOUS | Status: AC
  Administered 2015-05-30: 4 mg via INTRAVENOUS
  Filled 2015-05-30: qty 1

## 2015-05-30 MED ORDER — DEXTROSE 5 % IV SOLN
1.0000 g | Freq: Once | INTRAVENOUS | Status: AC
  Administered 2015-05-30: 1 g via INTRAVENOUS
  Filled 2015-05-30: qty 10

## 2015-05-30 MED ORDER — ONDANSETRON HCL 4 MG PO TABS: 4.0000 mg | ORAL_TABLET | Freq: Four times a day (QID) | ORAL | Status: DC

## 2015-05-30 MED ORDER — CEPHALEXIN 500 MG PO CAPS: 500.0000 mg | ORAL_CAPSULE | Freq: Four times a day (QID) | ORAL | Status: DC

## 2015-05-30 MED ORDER — OXYCODONE HCL 5 MG PO TABS
5.0000 mg | ORAL_TABLET | Freq: Once | ORAL | Status: AC
  Administered 2015-05-30: 5 mg via ORAL
  Filled 2015-05-30: qty 1

## 2015-05-30 MED ORDER — HYDROMORPHONE HCL 1 MG/ML IJ SOLN
1.0000 mg | Freq: Once | INTRAMUSCULAR | Status: AC
Start: 2015-05-30 — End: 2015-05-30
  Administered 2015-05-30: 1 mg via INTRAVENOUS
  Filled 2015-05-30: qty 1

## 2015-05-30 MED ORDER — KETOROLAC TROMETHAMINE 30 MG/ML IJ SOLN
30.0000 mg | Freq: Once | INTRAMUSCULAR | Status: AC
  Administered 2015-05-30: 30 mg via INTRAVENOUS
  Filled 2015-05-30: qty 1

## 2015-05-30 MED ORDER — NAPROXEN 500 MG PO TABS: 500.0000 mg | ORAL_TABLET | Freq: Two times a day (BID) | ORAL | Status: AC

## 2015-05-30 NOTE — ED Notes (Signed)
Right lower back pain since Thursday, no injury, radiates down R leg. No urinary or bowel incontinence.

## 2015-05-30 NOTE — ED Notes (Signed)
Pt given sprite 

## 2015-05-30 NOTE — Discharge Instructions (Signed)
Pyelonephritis, Adult Take the antibiotics as prescribed. Follow up with your primary doctor. Return to the ED sooner if you have worsening pain, fever, vomiting or any other concerns. Pyelonephritis is a kidney infection. The kidneys are the organs that filter a person's blood and move waste out of the bloodstream and into the urine. Urine passes from the kidneys, through the ureters, and into the bladder. There are two main types of pyelonephritis:  Infections that come on quickly without any warning (acute pyelonephritis).  Infections that last for a long period of time (chronic pyelonephritis). In most cases, the infection clears up with treatment and does not cause further problems. More severe infections or chronic infections can sometimes spread to the bloodstream or lead to other problems with the kidneys. CAUSES This condition is usually caused by:  Bacteria traveling from the bladder to the kidney through infected urine. The urine in the bladder can become infected with bacteria from:  Bladder infection (cystitis).  Inflammation of the prostate gland (prostatitis).  Sexual intercourse, in females.  Bacteria traveling from the bloodstream to the kidney. RISK FACTORS This condition is more likely to develop in:  Pregnant women.  Older people.  People who have diabetes.  People who have kidney stones or bladder stones.  People who have other abnormalities of the kidney or ureter.  People who have a catheter placed in the bladder.  People who have cancer.  People who are sexually active.  Women who use spermicides.  People who have had a prior urinary tract infection. SYMPTOMS Symptoms of this condition include:  Frequent urination.  Strong or persistent urge to urinate.  Burning or stinging when urinating.  Abdominal pain.  Back pain.  Pain in the side or flank area.  Fever.  Chills.  Blood in the urine, or dark  urine.  Nausea.  Vomiting. DIAGNOSIS This condition may be diagnosed based on:  Medical history and physical exam.  Urine tests.  Blood tests. You may also have imaging tests of the kidneys, such as an ultrasound or CT scan. TREATMENT Treatment for this condition may depend on the severity of the infection.  If the infection is mild and is found early, you may be treated with antibiotic medicines taken by mouth. You will need to drink fluids to remain hydrated.  If the infection is more severe, you may need to stay in the hospital and receive antibiotics given directly into a vein through an IV tube. You may also need to receive fluids through an IV tube if you are not able to remain hydrated. After your hospital stay, you may need to take oral antibiotics for a period of time. Other treatments may be required, depending on the cause of the infection. HOME CARE INSTRUCTIONS Medicines  Take over-the-counter and prescription medicines only as told by your health care provider.  If you were prescribed an antibiotic medicine, take it as told by your health care provider. Do not stop taking the antibiotic even if you start to feel better. General Instructions  Drink enough fluid to keep your urine clear or pale yellow.  Avoid caffeine, tea, and carbonated beverages. They tend to irritate the bladder.  Urinate often. Avoid holding in urine for long periods of time.  Urinate before and after sex.  After a bowel movement, women should cleanse from front to back. Use each tissue only once.  Keep all follow-up visits as told by your health care provider. This is important. SEEK MEDICAL CARE IF:  Your  symptoms do not get better after 2 days of treatment.  Your symptoms get worse.  You have a fever. SEEK IMMEDIATE MEDICAL CARE IF:  You are unable to take your antibiotics or fluids.  You have shaking chills.  You vomit.  You have severe flank or back pain.  You have  extreme weakness or fainting.   This information is not intended to replace advice given to you by your health care provider. Make sure you discuss any questions you have with your health care provider.   Document Released: 01/02/2005 Document Revised: 09/23/2014 Document Reviewed: 04/27/2014 Elsevier Interactive Patient Education [employer].

## 2015-05-30 NOTE — ED Provider Notes (Signed)
CSN: [id number]     Arrival date & time 05/30/15  1103 History   First MD Initiated Contact with Patient 05/30/15 1150     Chief Complaint  Patient presents with  . 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 Pain does not radiate down leg, contrary to triage note. No history of back problems. Saw "teledoc" who thought it was a muscle strain. Taking ibuprofen without relief. Denies urinary symptoms. No vaginal bleeding or discharge. No CP or SOB. No fever.  No focal weakness, numbness, tingling. No bowel or bladder incontinence.  The history is provided by the patient.    Past Medical History  Diagnosis Date  . Anemia   . Blood transfusion without reported diagnosis   . Iron deficiency anemia, unspecified 08/05/2013  . 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 Unspecified vitamin D deficiency 08/05/2013   Past Surgical History  Procedure Laterality Date  . Appendectomy    . Cholecystectomy    . Gastric bypass    . Abdominal hysterectomy     No family history on file. Social History  Substance Use Topics  . Smoking status: Never Smoker   . Smokeless tobacco: None  . Alcohol Use: No   OB History    Gravida Para Term Preterm AB TAB SAB Ectopic Multiple Living   1              Review of Systems  Constitutional: Negative for activity change, appetite change and fatigue.  Respiratory: Negative for cough, chest tightness and shortness of breath.   Gastrointestinal: Positive for abdominal pain. Negative for nausea and vomiting.  Genitourinary: Negative for dysuria, hematuria, vaginal bleeding and vaginal discharge.  Musculoskeletal: Positive for back pain. Negative for neck pain.  Skin: Negative for rash.  Neurological: Negative for dizziness, weakness and headaches.   A complete 10 system review of systems was obtained  and all systems are negative except as noted in the HPI and PMH.    Allergies  Acetaminophen  Home Medications   Prior to Admission medications   Medication Sig Start Date End Date Taking? Authorizing Provider  Ferrous Sulfate (IRON) 325 (65 FE) MG TABS Take by mouth daily.   Yes Historical Provider, MD  FLUoxetine (PROZAC) 20 MG capsule Take 20 mg by mouth daily.   Yes Historical Provider, MD  Multiple Vitamin (MULTIVITAMIN) capsule Take 1 capsule by mouth daily.   Yes Historical Provider, MD  albuterol (PROVENTIL HFA;VENTOLIN HFA) 108 (90 BASE) MCG/ACT inhaler Inhale 2 puffs into the lungs every 4 (four) hours as needed for wheezing or shortness of breath (cough, shortness of breath or wheezing.). 11/18/13   Emi Belfasteborah B Gessner, FNP  amoxicillin (AMOXIL) 875 MG tablet Take 1 tablet (875 mg total) by mouth 2 (two) times daily. 11/18/13   Emi Belfasteborah B Gessner, FNP  cephALEXin (KEFLEX) 500 MG capsule Take 1 capsule (500 mg total) by mouth 4 (four) times daily. 05/30/15   Glynn OctaveStephen Qualyn Oyervides, MD  naproxen (NAPROSYN) 500 MG tablet Take 1 tablet (500 mg total) by mouth 2 (two) times daily. 05/30/15   Glynn OctaveStephen Jeffren Dombek, MD  nitrofurantoin, macrocrystal-monohydrate, (MACROBID) 100 MG capsule Take 1 capsule (100 mg total) by mouth 2 (two) times daily. 11/18/13   Emi Belfasteborah B Gessner, FNP  ondansetron (ZOFRAN) 4 MG tablet Take 1 tablet (4 mg total) by mouth every 6 (six) hours. 05/30/15   Glynn OctaveStephen Shaguana Love,  MD   BP 108/65 mmHg  Pulse 55  Temp(Src) 98.1 F (36.7 C) (Oral)  Resp 16  Ht  (1.676 m)  Wt 135 lb (61.236 kg)  BMI 21.80 kg/m2  SpO2 96%  LMP 07/22/2013  Breastfeeding? Unknown Physical Exam  Constitutional: She is oriented to person, place, and time. She appears well-developed and well-nourished. No distress.  HENT:  Head: Normocephalic and atraumatic.  Mouth/Throat: Oropharynx is clear and moist. No oropharyngeal exudate.  Eyes: Conjunctivae and EOM are normal. Pupils are equal, round, and reactive  to light.  Neck: Normal range of motion. Neck supple.  No meningismus.  Cardiovascular: Normal rate, regular rhythm, normal heart sounds and intact distal pulses.   No murmur heard. Pulmonary/Chest: Effort normal and breath sounds normal. No respiratory distress.  Abdominal: Soft. There is tenderness. There is no rebound and no guarding.  TTP RUQ and RLQ with voluntary guarding.  Musculoskeletal: Normal range of motion. She exhibits tenderness. She exhibits no edema.  R CVAT  Neurological: She is alert and oriented to person, place, and time. No cranial nerve deficit. She exhibits normal muscle tone. Coordination normal.  No ataxia on finger to nose bilaterally. No pronator drift. 5/5 strength throughout. CN 2-12 intact.Equal grip strength. Sensation intact.   Skin: Skin is warm.  Psychiatric: She has a normal mood and affect. Her behavior is normal.  Nursing note and vitals reviewed.   ED Course  Procedures (including critical care time) Labs Review Labs Reviewed  URINALYSIS, ROUTINE W REFLEX MICROSCOPIC (NOT AT [facility]) - Abnormal; Notable for the following:    APPearance CLOUDY (*)    Nitrite POSITIVE (*)    Leukocytes, UA MODERATE (*)    All other components within normal limits  CBC WITH DIFFERENTIAL/PLATELET - Abnormal; Notable for the following:    Hemoglobin 10.6 (*)    HCT 32.9 (*)    MCH 25.7 (*)    RDW 18.4 (*)    All other components within normal limits  COMPREHENSIVE METABOLIC PANEL - Abnormal; Notable for the following:    Calcium 8.6 (*)    All other components within normal limits  URINE MICROSCOPIC-ADD ON - Abnormal; Notable for the following:    Squamous Epithelial / LPF 0-5 (*)    Bacteria, UA MANY (*)    All other components within normal limits  URINE CULTURE  PREGNANCY, URINE  LIPASE, BLOOD    Imaging Review Ct Renal Stone Study  05/30/2015  CLINICAL DATA:  Acute onset right flank pain four days ago. EXAM: CT ABDOMEN AND PELVIS WITHOUT CONTRAST  TECHNIQUE: Multidetector CT imaging of the abdomen and pelvis was performed following the standard protocol without IV contrast. COMPARISON:  None. FINDINGS: Lower chest: Lung bases clear without infiltrate or effusion. Heart size normal. Hepatobiliary: Unenhanced images the liver are normal. Gallbladder absent. Bile ducts nondilated. Common bile duct 8 mm in diameter. Pancreas: Negative Spleen: Small splenic cyst posteriorly.  Splenic size normal. Adrenals/Urinary Tract: Mild enlargement of the right kidney with mild perinephric edema suggesting acute obstruction. No significant hydronephrosis. There is a 3 mm calcification the right lower pelvis which appears to be a phlebolith rather than a ureteral stone. Possibly recently passed ureteral stone causing obstruction. Left kidney normal. No renal calculi. No renal mass. Stomach/Bowel: Prior gastrectomy and gastrojejunostomy. No bowel obstruction. No bowel edema. Appendectomy clips in the right lower quadrant. Vascular/Lymphatic: Normal aorta and IVC.  No lymphadenopathy. Reproductive: No pelvic mass lesion.  Hysterectomy changes. Other: No free fluid. Musculoskeletal: Negative  IMPRESSION: Evidence of recent right renal obstruction. No definite renal calculi. 3 mm calcification right lower pelvis felt to be phleboliths. Possible recently passed kidney stone on the right. Prior appendectomy. Prior gastric bypass with gastrojejunostomy. Normal bowel gas pattern. Electronically Signed   By: Marlan Palau M.D.   On: 05/30/2015 15:06   I have personally reviewed and evaluated these images and lab results as part of my medical decision-making.   EKG Interpretation None      MDM   Final diagnoses:  Pyelonephritis  Flank pain without injury. Also abdominal pain. Denies urinary symptoms.  UA appears infected, HCG negative. Rocephin given, culture sent.  CT obtained given degree of pain to r/o stone.  CT shows no definite renal calculi but does show  calcification in the right lower pelvis suspected to be phlebolith. Suspect recently passed kidney stone.  CT scan results d/w Dr. Laverle Patter of urology.  No evidence of ureteral obstruction. Possibly passed a stone versus pyelonephritis. The phlebolith is not within the ureter.  Pain improved, tolerating PO, abdomen soft.  Treat for pyelonephritis.  Follow up with PCP, return precautions discussed.  Glynn Octave, MD 05/30/15 [phone]

## 2015-06-01 LAB — URINE CULTURE

## 2015-06-02 ENCOUNTER — Telehealth: Payer: Self-pay | Admitting: *Deleted

## 2015-06-02 NOTE — ED Notes (Signed)
Post ED Visit - Positive Culture Follow-up  Culture report reviewed by antimicrobial stewardship pharmacist:  []  Enzo BiNathan Batchelder, Pharm.D. []  Celedonio MiyamotoJeremy Frens, Pharm.D., BCPS []  Garvin FilaMike Maccia, Pharm.D. []  Georgina PillionElizabeth Martin, Pharm.D., BCPS []  The ColonyMinh Pham, [address]Ph[address]D., BCPS, AAHIVP []  Estella HuskMichelle Turner, Pharm.D., BCPS, AAHIVP [x]  Tennis Mustassie Stewart, [address]Pharm[address] []  Sherle Poeob Vincent, [address]  Positive urine culture Treated with Cephalexin, organism sensitive to the same and no further patient follow-up is required at this time.  Virl AxeRobertson, Mee Macdonnell [facility] [place] 06/02/2015, 10:30 AM

## 2015-09-24 ENCOUNTER — Emergency Department (HOSPITAL_BASED_OUTPATIENT_CLINIC_OR_DEPARTMENT_OTHER)
Admission: EM | Admit: 2015-09-24 | Discharge: 2015-09-24 | Disposition: A | Discharge status: Home / Self Care | Payer: BLUE CROSS/BLUE SHIELD | Attending: Emergency Medicine | Admitting: Emergency Medicine

## 2015-09-24 ENCOUNTER — Emergency Department (HOSPITAL_BASED_OUTPATIENT_CLINIC_OR_DEPARTMENT_OTHER): Payer: BLUE CROSS/BLUE SHIELD

## 2015-09-24 ENCOUNTER — Encounter (HOSPITAL_BASED_OUTPATIENT_CLINIC_OR_DEPARTMENT_OTHER): Payer: Self-pay | Admitting: *Deleted

## 2015-09-24 DIAGNOSIS — Y999 Unspecified external cause status: Secondary | ICD-10-CM | POA: Diagnosis not present

## 2015-09-24 DIAGNOSIS — Y929 Unspecified place or not applicable: Secondary | ICD-10-CM | POA: Insufficient documentation

## 2015-09-24 DIAGNOSIS — W268XXA Contact with other sharp object(s), not elsewhere classified, initial encounter: Secondary | ICD-10-CM | POA: Diagnosis not present

## 2015-09-24 DIAGNOSIS — S61211A Laceration without foreign body of left index finger without damage to nail, initial encounter: Secondary | ICD-10-CM | POA: Insufficient documentation

## 2015-09-24 DIAGNOSIS — S61219A Laceration without foreign body of unspecified finger without damage to nail, initial encounter: Secondary | ICD-10-CM

## 2015-09-24 DIAGNOSIS — Y93E5 Activity, floor mopping and cleaning: Secondary | ICD-10-CM | POA: Insufficient documentation

## 2015-09-24 DIAGNOSIS — Z79899 Other long term (current) drug therapy: Secondary | ICD-10-CM | POA: Insufficient documentation

## 2015-09-24 MED ORDER — LIDOCAINE HCL 2 % IJ SOLN
20.0000 mL | Freq: Once | INTRAMUSCULAR | Status: AC
  Administered 2015-09-24: 400 mg
  Filled 2015-09-24: qty 20

## 2015-09-24 MED ORDER — IBUPROFEN 800 MG PO TABS
800.0000 mg | ORAL_TABLET | Freq: Once | ORAL | Status: AC
  Administered 2015-09-24: 800 mg via ORAL
  Filled 2015-09-24: qty 1

## 2015-09-24 NOTE — Discharge Instructions (Signed)
Keep the area clean and dry.  Follow-up for any signs of infection, have the sutures out in the next 7-10 days

## 2015-09-24 NOTE — ED Provider Notes (Signed)
[facility]   CSN: [id number] Arrival date & time: 09/24/15  1138     History   Chief Complaint Chief Complaint  Patient presents with  . Laceration    HPI Cindy Hodge is a 35 y.o. female.  HPI Patient presents to the emergency department with a laceration to the left second digit.  The patient states she was cleaning her glasstop stove when she was using the razor edged scraper and cut her finger.  The patient states that she applied pressure to stop the bleeding.  She states that she did not take any medications prior to arrival Past Medical History:  Diagnosis Date  . Anemia   . Blood transfusion without reported diagnosis   . Iron deficiency anemia, unspecified 08/05/2013  . 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 Unspecified vitamin D deficiency 08/05/2013    Patient Active Problem List   Diagnosis Date Noted  . Iron deficiency anemia, unspecified 08/05/2013  . 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 Unspecified vitamin D deficiency 08/05/2013    Past Surgical History:  Procedure Laterality Date  . ABDOMINAL HYSTERECTOMY    . APPENDECTOMY    . CHOLECYSTECTOMY    . GASTRIC BYPASS      OB History    Gravida Para Term Preterm AB Living   1             SAB TAB Ectopic Multiple Live Births                   Home Medications    Prior to Admission medications   Medication Sig Start Date End Date Taking? Authorizing Provider  Amphetamine-Dextroamphetamine (ADDERALL PO) Take by mouth.   Yes Historical Provider, MD  FLUoxetine (PROZAC) 20 MG capsule Take 20 mg by mouth daily.   Yes Historical Provider, MD  albuterol (PROVENTIL HFA;VENTOLIN HFA) 108 (90 BASE) MCG/ACT inhaler Inhale 2 puffs into the lungs every 4 (four) hours as needed for wheezing or shortness of breath (cough, shortness of breath or wheezing.). 11/18/13   Emi Belfasteborah B Gessner, FNP  amoxicillin (AMOXIL) 875 MG tablet Take 1 tablet (875 mg total) by mouth 2 (two) times daily. 11/18/13    Emi Belfasteborah B Gessner, FNP  cephALEXin (KEFLEX) 500 MG capsule Take 1 capsule (500 mg total) by mouth 4 (four) times daily. 05/30/15   Glynn OctaveStephen Rancour, MD  Ferrous Sulfate (IRON) 325 (65 FE) MG TABS Take by mouth daily.    Historical Provider, MD  Multiple Vitamin (MULTIVITAMIN) capsule Take 1 capsule by mouth daily.    Historical Provider, MD  naproxen (NAPROSYN) 500 MG tablet Take 1 tablet (500 mg total) by mouth 2 (two) times daily. 05/30/15   Glynn OctaveStephen Rancour, MD  nitrofurantoin, macrocrystal-monohydrate, (MACROBID) 100 MG capsule Take 1 capsule (100 mg total) by mouth 2 (two) times daily. 11/18/13   Emi Belfasteborah B Gessner, FNP  ondansetron (ZOFRAN) 4 MG tablet Take 1 tablet (4 mg total) by mouth every 6 (six) hours. 05/30/15   Glynn OctaveStephen Rancour, MD    Family History No family history on file.  Social History Social History  Substance Use Topics  . Smoking status: Never Smoker  . Smokeless tobacco: Never Used  . Alcohol use No     Allergies   Acetaminophen   Review of Systems Review of Systems All other systems negative except as documented in the HPI. All pertinent positives and negatives as reviewed in the HPI.  Physical Exam Updated Vital Signs BP 127/81 (BP Location: Right Arm)  Pulse 66   Temp 98.8 F (37.1 C) (Oral)   Resp 18   Ht 5\' 7"  (1.702 m)   Wt 56.7 kg   LMP 07/22/2013   SpO2 98%   BMI 19.58 kg/m   Physical Exam  Constitutional: She is oriented to person, place, and time. She appears well-developed and well-nourished. No distress.  HENT:  Head: Normocephalic and atraumatic.  Eyes: Pupils are equal, round, and reactive to light.  Neck: Normal range of motion.  Pulmonary/Chest: Effort normal.  Neurological: She is alert and oriented to person, place, and time.     ED Treatments / Results  Labs (all labs ordered are listed, but only abnormal results are displayed) Labs Reviewed - No data to display  EKG  EKG Interpretation None       Radiology Dg  Finger Index Left  Result Date: 09/24/2015 CLINICAL DATA:  Laceration to the posterior aspect of the left and next finger on an razor blade this morning. EXAM: LEFT INDEX FINGER 2+V COMPARISON:  None in PACs FINDINGS: The bones are adequately mineralized. The joint spaces are preserved. The soft tissues exhibit no foreign bodies. IMPRESSION: There is no acute bony abnormality of the left index finger. No radiopaque foreign bodies are observed. Electronically Signed   By: David  [place] M.D.   On: 09/24/2015 12:21    Procedures Procedures (including critical care time)  Medications Ordered in ED Medications  ibuprofen (ADVIL,MOTRIN) tablet 800 mg (800 mg Oral Given 09/24/15 1230)  lidocaine (XYLOCAINE) 2 % (with pres) injection 400 mg (400 mg Infiltration Given 09/24/15 1230)     Initial Impression / Assessment and Plan / ED Course  I have reviewed the triage vital signs and the nursing notes.  Pertinent labs & imaging results that were available during my care of the patient were reviewed by me and considered in my medical decision making (see chart for details).  Clinical Course    LACERATION REPAIR Performed by: Carlyle Dolly Authorized by: Carlyle Dolly Consent: Verbal consent obtained. Risks and benefits: risks, benefits and alternatives were discussed Consent given by: patient Patient identity confirmed: provided demographic data Prepped and Draped in normal sterile fashion Wound explored  Laceration Location: Left second digit laterally  Laceration Length: 2.5 cm  No Foreign Bodies seen or palpated  Anesthesia: local infiltration  Local anesthetic: lidocaine 2 % without epinephrine  Anesthetic total: 3 ml  Irrigation method: syringe Amount of cleaning: standard  Skin closure: 4-0 Prolene   Number of sutures: 2 and 4 Steri-Strips on the distal end of the wound that was superficial   Technique: Simple interrupted   Patient tolerance: Patient tolerated  the procedure well with no immediate complications.   Final Clinical Impressions(s) / ED Diagnoses   Final diagnoses:  None   Patient is advised to have sutures out in 7-10 days.  Told to return here as needed for any signs of infection.  Keep the area clean and dry New Prescriptions New Prescriptions   No medications on file     Charlestine Night, PA-C 09/24/15 1330    Geoffery Lyons, MD 09/24/15 1535

## 2015-09-24 NOTE — ED Triage Notes (Signed)
Laceration to her left 2nd digit with a razor blade while cleaning her stove. Bleeding controlled.

## 2015-11-11 ENCOUNTER — Emergency Department (HOSPITAL_BASED_OUTPATIENT_CLINIC_OR_DEPARTMENT_OTHER)
Admission: EM | Admit: 2015-11-11 | Discharge: 2015-11-11 | Disposition: A | Discharge status: Home / Self Care | Payer: BLUE CROSS/BLUE SHIELD | Attending: Emergency Medicine | Admitting: Emergency Medicine

## 2015-11-11 ENCOUNTER — Encounter (HOSPITAL_BASED_OUTPATIENT_CLINIC_OR_DEPARTMENT_OTHER): Payer: Self-pay

## 2015-11-11 DIAGNOSIS — G43809 Other migraine, not intractable, without status migrainosus: Secondary | ICD-10-CM

## 2015-11-11 DIAGNOSIS — Z79891 Long term (current) use of opiate analgesic: Secondary | ICD-10-CM | POA: Diagnosis not present

## 2015-11-11 DIAGNOSIS — G43909 Migraine, unspecified, not intractable, without status migrainosus: Secondary | ICD-10-CM | POA: Diagnosis present

## 2015-11-11 DIAGNOSIS — Z79899 Other long term (current) drug therapy: Secondary | ICD-10-CM | POA: Insufficient documentation

## 2015-11-11 LAB — CBC WITH DIFFERENTIAL/PLATELET
BASOS ABS: 0 10*3/uL (ref 0.0–0.1)
BASOS PCT: 0 %
EOS ABS: 0.1 10*3/uL (ref 0.0–0.7)
Eosinophils Relative: 1 %
HEMATOCRIT: 36.9 % (ref 36.0–46.0)
HEMOGLOBIN: 11.7 g/dL — AB (ref 12.0–15.0)
Lymphocytes Relative: 11 %
Lymphs Abs: 0.8 10*3/uL (ref 0.7–4.0)
MCH: 25.3 pg — ABNORMAL LOW (ref 26.0–34.0)
MCHC: 31.7 g/dL (ref 30.0–36.0)
MCV: 79.7 fL (ref 78.0–100.0)
MONO ABS: 0.6 10*3/uL (ref 0.1–1.0)
MONOS PCT: 8 %
NEUTROS ABS: 5.6 10*3/uL (ref 1.7–7.7)
NEUTROS PCT: 80 %
Platelets: 301 10*3/uL (ref 150–400)
RBC: 4.63 MIL/uL (ref 3.87–5.11)
RDW: 18.5 % — AB (ref 11.5–15.5)
WBC: 7 10*3/uL (ref 4.0–10.5)

## 2015-11-11 LAB — BASIC METABOLIC PANEL
ANION GAP: 7 (ref 5–15)
BUN: 8 mg/dL (ref 6–20)
CALCIUM: 8.4 mg/dL — AB (ref 8.9–10.3)
CO2: 22 mmol/L (ref 22–32)
CREATININE: 0.66 mg/dL (ref 0.44–1.00)
Chloride: 107 mmol/L (ref 101–111)
GFR calc non Af Amer: >60 mL/min (ref >60)
Glucose, Bld: 89 mg/dL (ref 65–99)
Potassium: 3.9 mmol/L (ref 3.5–5.1)
SODIUM: 136 mmol/L (ref 135–145)

## 2015-11-11 MED ORDER — SODIUM CHLORIDE 0.9 % IV BOLUS (SEPSIS)
1000.0000 mL | Freq: Once | INTRAVENOUS | Status: AC
  Administered 2015-11-11: 1000 mL via INTRAVENOUS

## 2015-11-11 MED ORDER — KETOROLAC TROMETHAMINE 30 MG/ML IJ SOLN
30.0000 mg | Freq: Once | INTRAMUSCULAR | Status: AC
  Administered 2015-11-11: 30 mg via INTRAVENOUS
  Filled 2015-11-11: qty 1

## 2015-11-11 MED ORDER — MAGNESIUM SULFATE 2 GM/50ML IV SOLN
2.0000 g | Freq: Once | INTRAVENOUS | Status: AC
  Administered 2015-11-11: 2 g via INTRAVENOUS
  Filled 2015-11-11: qty 50

## 2015-11-11 MED ORDER — METHYLPREDNISOLONE SODIUM SUCC 125 MG IJ SOLR
125.0000 mg | Freq: Once | INTRAMUSCULAR | Status: AC
  Administered 2015-11-11: 125 mg via INTRAVENOUS
  Filled 2015-11-11: qty 2

## 2015-11-11 MED ORDER — PROCHLORPERAZINE EDISYLATE 5 MG/ML IJ SOLN
10.0000 mg | Freq: Once | INTRAMUSCULAR | Status: AC
  Administered 2015-11-11: 10 mg via INTRAVENOUS
  Filled 2015-11-11: qty 2

## 2015-11-11 MED ORDER — SUMATRIPTAN SUCCINATE 6 MG/0.5ML ~~LOC~~ SOLN
6.0000 mg | Freq: Once | SUBCUTANEOUS | Status: AC
  Administered 2015-11-11: 6 mg via SUBCUTANEOUS
  Filled 2015-11-11: qty 0.5

## 2015-11-11 NOTE — Discharge Instructions (Signed)
Please follow with your primary care doctor in the next 2 days for a check-up. They must obtain records for further management.  ° °Do not hesitate to return to the Emergency Department for any new, worsening or concerning symptoms.  ° °

## 2015-11-11 NOTE — ED Provider Notes (Signed)
[facility] Provider Note   CSN: [id number] Arrival date & time: 11/11/15  1154     History   Chief Complaint Chief Complaint  Patient presents with  . Migraine    HPI   Blood pressure 118/88, pulse 94, temperature 98.1 F (36.7 C), temperature source Oral, resp. rate 18, height 5\' 6"  (1.676 m), weight 51.7 kg, last menstrual period 07/22/2013, SpO2 100 %, unknown if currently breastfeeding.  Cindy Hodge is a 35 y.o. female complaining of Headache exacerbation onset 5 days ago right periorbital associated with nausea, blurred vision, photophobia. This is typical for her migraine exacerbations, she is followed by urology for this, she has been compliant with her prophylactic Topamax, she takes tramadol and nortriptyline for treatment however, these have not been helpful to her. Pt denies fever, rash, confusion, cervicalgia, LOC/syncope,  numbness, weakness, dysarthria, ataxia, thunderclap onset, exacerbation with exertion or valsalva, exacerbation in morning, CP, SOB, abdominal pain. Patient voluntarily offers that this is not the worst headache of her life.  Past Medical History:  Diagnosis Date  . Anemia   . Blood transfusion without reported diagnosis   . Iron deficiency anemia, unspecified 08/05/2013  . 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 Unspecified vitamin D deficiency 08/05/2013    Patient Active Problem List   Diagnosis Date Noted  . Iron deficiency anemia, unspecified 08/05/2013  . 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 Unspecified vitamin D deficiency 08/05/2013    Past Surgical History:  Procedure Laterality Date  . ABDOMINAL HYSTERECTOMY    . APPENDECTOMY    . CHOLECYSTECTOMY    . GASTRIC BYPASS      OB History    Gravida Para Term Preterm AB Living   1             SAB TAB Ectopic Multiple Live Births                   Home Medications    Prior to Admission medications   Medication Sig Start Date End Date Taking? Authorizing Provider    nortriptyline (PAMELOR) 25 MG capsule Take 25 mg by mouth at bedtime.   Yes Historical Provider, MD  traMADol (ULTRAM) 50 MG tablet Take by mouth every 6 (six) hours as needed.   Yes Historical Provider, MD  albuterol (PROVENTIL HFA;VENTOLIN HFA) 108 (90 BASE) MCG/ACT inhaler Inhale 2 puffs into the lungs every 4 (four) hours as needed for wheezing or shortness of breath (cough, shortness of breath or wheezing.). 11/18/13   Emi Belfast, FNP  Amphetamine-Dextroamphetamine (ADDERALL PO) Take by mouth.    Historical Provider, MD  Ferrous Sulfate (IRON) 325 (65 FE) MG TABS Take by mouth daily.    Historical Provider, MD  FLUoxetine (PROZAC) 20 MG capsule Take 20 mg by mouth daily.    Historical Provider, MD  Multiple Vitamin (MULTIVITAMIN) capsule Take 1 capsule by mouth daily.    Historical Provider, MD  naproxen (NAPROSYN) 500 MG tablet Take 1 tablet (500 mg total) by mouth 2 (two) times daily. 05/30/15   Glynn Octave, MD    Family History No family history on file.  Social History Social History  Substance Use Topics  . Smoking status: Never Smoker  . Smokeless tobacco: Never Used  . Alcohol use No     Allergies   Acetaminophen   Review of Systems Review of Systems  10 systems reviewed and found to be negative, except as noted in the HPI.   Physical Exam Updated Vital Signs  BP 114/66 (BP Location: Right Arm)   Pulse 61   Temp 98.1 F (36.7 C) (Oral)   Resp 18   Ht 5\' 6"  (1.676 m)   Wt 51.7 kg   LMP 07/22/2013   SpO2 100%   BMI 18.40 kg/m   Physical Exam  Constitutional: She is oriented to person, place, and time. She appears well-developed and well-nourished. No distress.  Holding hand over right eye, lights off in the room, appears acutely uncomfortable.  HENT:  Head: Normocephalic and atraumatic.  Mouth/Throat: Oropharynx is clear and moist.  Eyes: Conjunctivae and EOM are normal. Pupils are equal, round, and reactive to light.  No TTP of maxillary or  frontal sinuses  No TTP or induration of temporal arteries bilaterally  Neck: Normal range of motion. Neck supple.  FROM to C-spine. Pt can touch chin to chest without discomfort. No TTP of midline cervical spine.   Cardiovascular: Normal rate, regular rhythm and intact distal pulses.   Pulmonary/Chest: Effort normal and breath sounds normal. No respiratory distress. She has no wheezes. She has no rales. She exhibits no tenderness.  Abdominal: Soft. Bowel sounds are normal. There is no tenderness.  Musculoskeletal: Normal range of motion. She exhibits no edema or tenderness.  Neurological: She is alert and oriented to person, place, and time. No cranial nerve deficit.  II-Visual fields grossly intact. III/IV/VI-Extraocular movements intact.  Pupils reactive bilaterally. V/VII-Smile symmetric, equal eyebrow raise,  facial sensation intact VIII- Hearing grossly intact IX/X-Normal gag XI-bilateral shoulder shrug XII-midline tongue extension Motor: 5/5 bilaterally with normal tone and bulk Cerebellar: Normal finger-to-nose  and normal heel-to-shin test.   Romberg negative Ambulates with a coordinated gait   Skin: She is not diaphoretic.  Psychiatric: She has a normal mood and affect.  Nursing note and vitals reviewed.    ED Treatments / Results  Labs (all labs ordered are listed, but only abnormal results are displayed) Labs Reviewed  CBC WITH DIFFERENTIAL/PLATELET - Abnormal; Notable for the following:       Result Value   Hemoglobin 11.7 (*)    MCH 25.3 (*)    RDW 18.5 (*)    All other components within normal limits  BASIC METABOLIC PANEL - Abnormal; Notable for the following:    Calcium 8.4 (*)    All other components within normal limits    EKG  EKG Interpretation None       Radiology No results found.  Procedures Procedures (including critical care time)  Medications Ordered in ED Medications  sodium chloride 0.9 % bolus 1,000 mL (0 mLs Intravenous Stopped  11/11/15 1330)  methylPREDNISolone sodium succinate (SOLU-MEDROL) 125 mg/2 mL injection 125 mg (125 mg Intravenous Given 11/11/15 1305)  prochlorperazine (COMPAZINE) injection 10 mg (10 mg Intravenous Given 11/11/15 1305)  magnesium sulfate IVPB 2 g 50 mL (0 g Intravenous Stopped 11/11/15 1425)  SUMAtriptan (IMITREX) injection 6 mg (6 mg Subcutaneous Given 11/11/15 1305)  ketorolac (TORADOL) 30 MG/ML injection 30 mg (30 mg Intravenous Given 11/11/15 1412)     Initial Impression / Assessment and Plan / ED Course  I have reviewed the triage vital signs and the nursing notes.  Pertinent labs & imaging results that were available during my care of the patient were reviewed by me and considered in my medical decision making (see chart for details).  Clinical Course    Vitals:   11/11/15 1203 11/11/15 1414  BP: 118/88 114/66  Pulse: 94 61  Resp: 18 18  Temp: 98.1 F (36.7  C)   TempSrc: Oral   SpO2: 100% 100%  Weight: 51.7 kg   Height: 5\' 6"  (1.676 m)     Medications  sodium chloride 0.9 % bolus 1,000 mL (0 mLs Intravenous Stopped 11/11/15 1330)  methylPREDNISolone sodium succinate (SOLU-MEDROL) 125 mg/2 mL injection 125 mg (125 mg Intravenous Given 11/11/15 1305)  prochlorperazine (COMPAZINE) injection 10 mg (10 mg Intravenous Given 11/11/15 1305)  magnesium sulfate IVPB 2 g 50 mL (0 g Intravenous Stopped 11/11/15 1425)  SUMAtriptan (IMITREX) injection 6 mg (6 mg Subcutaneous Given 11/11/15 1305)  ketorolac (TORADOL) 30 MG/ML injection 30 mg (30 mg Intravenous Given 11/11/15 1412)    Cindy Hodge is 35 y.o. female presenting with HA. Presentation is like pts typical HA and non concerning for [facility], ICH, Meningitis, or temporal arteritis. Pt is afebrile with no focal neuro deficits, nuchal rigidity, or change in vision. Pt is to follow up with PCP to discuss prophylactic medication. Pt verbalizes understanding and is agreeable with plan to dc.  Evaluation does not show pathology  that would require ongoing emergent intervention or inpatient treatment. Pt is hemodynamically stable and mentating appropriately. Discussed findings and plan with patient/guardian, who agrees with care plan. All questions answered. Return precautions discussed and outpatient follow up given.   Final Clinical Impressions(s) / ED Diagnoses   Final diagnoses:  Other migraine without status migrainosus, not intractable    New Prescriptions New Prescriptions   No medications on file     Wynetta Emery, PA-C 11/11/15 1432    Maia Plan, MD 11/11/15 1454

## 2015-11-11 NOTE — ED Triage Notes (Signed)
C/o migraine since Sunday-steady gait-covering eyes with hand

## 2016-01-28 ENCOUNTER — Emergency Department (HOSPITAL_COMMUNITY): Payer: BLUE CROSS/BLUE SHIELD

## 2016-01-28 ENCOUNTER — Encounter (HOSPITAL_COMMUNITY): Payer: Self-pay | Admitting: *Deleted

## 2016-01-28 ENCOUNTER — Emergency Department (HOSPITAL_COMMUNITY)
Admission: EM | Admit: 2016-01-28 | Discharge: 2016-01-29 | Disposition: A | Discharge status: Home / Self Care | Payer: BLUE CROSS/BLUE SHIELD | Attending: Emergency Medicine | Admitting: Emergency Medicine

## 2016-01-28 DIAGNOSIS — R0789 Other chest pain: Secondary | ICD-10-CM | POA: Diagnosis not present

## 2016-01-28 DIAGNOSIS — R0781 Pleurodynia: Secondary | ICD-10-CM | POA: Diagnosis present

## 2016-01-28 LAB — HEPATIC FUNCTION PANEL
ALT: 44 U/L (ref 14–54)
AST: 45 U/L — AB (ref 15–41)
Albumin: 4 g/dL (ref 3.5–5.0)
Alkaline Phosphatase: 73 U/L (ref 38–126)
Total Bilirubin: <0.1 mg/dL — ABNORMAL LOW (ref 0.3–1.2)
Total Protein: 6.6 g/dL (ref 6.5–8.1)

## 2016-01-28 LAB — I-STAT TROPONIN, ED
TROPONIN I, POC: 0 ng/mL (ref 0.00–0.08)
TROPONIN I, POC: 0 ng/mL (ref 0.00–0.08)

## 2016-01-28 LAB — CBC
HEMATOCRIT: 39.3 % (ref 36.0–46.0)
HEMOGLOBIN: 12.9 g/dL (ref 12.0–15.0)
MCH: 28.8 pg (ref 26.0–34.0)
MCHC: 32.8 g/dL (ref 30.0–36.0)
MCV: 87.7 fL (ref 78.0–100.0)
Platelets: 280 10*3/uL (ref 150–400)
RBC: 4.48 MIL/uL (ref 3.87–5.11)
RDW: 20.2 % — ABNORMAL HIGH (ref 11.5–15.5)
WBC: 7.1 10*3/uL (ref 4.0–10.5)

## 2016-01-28 LAB — BASIC METABOLIC PANEL
ANION GAP: 7 (ref 5–15)
BUN: 14 mg/dL (ref 6–20)
CALCIUM: 8.7 mg/dL — AB (ref 8.9–10.3)
CHLORIDE: 108 mmol/L (ref 101–111)
CO2: 20 mmol/L — AB (ref 22–32)
Creatinine, Ser: 0.65 mg/dL (ref 0.44–1.00)
GFR calc non Af Amer: >60 mL/min (ref >60)
GLUCOSE: 86 mg/dL (ref 65–99)
Potassium: 4.2 mmol/L (ref 3.5–5.1)
Sodium: 135 mmol/L (ref 135–145)

## 2016-01-28 LAB — LIPASE, BLOOD: Lipase: 14 U/L (ref 11–51)

## 2016-01-28 LAB — D-DIMER, QUANTITATIVE: D-Dimer, Quant: <0.27 ug/mL-FEU (ref 0.00–0.50)

## 2016-01-28 MED ORDER — IBUPROFEN 400 MG PO TABS
600.0000 mg | ORAL_TABLET | Freq: Once | ORAL | Status: AC
  Administered 2016-01-28: 600 mg via ORAL
  Filled 2016-01-28: qty 1

## 2016-01-28 NOTE — ED Notes (Signed)
Pt given graham crackers and peanut butter.

## 2016-01-28 NOTE — ED Notes (Signed)
Pt willing to stay for results of LFT and lipase

## 2016-01-28 NOTE — ED Triage Notes (Signed)
Pt reports rt sided chest pain that is a burning sensation for several weeks worsening today. Pt reports SOB. Pt reports using naproxen with no relief. No reported injury.

## 2016-01-28 NOTE — ED Notes (Signed)
Pt requesting update. Informed pt labs have resulted, Dr. Ethelda ChickJacubowitz at bedside

## 2016-01-28 NOTE — ED Provider Notes (Addendum)
Plains of right-sided chest pain onset 2-3 weeks ago, pleuritic, worse with changing position. She also reports vomiting 3 times over the past 2 weeks and has mild pain at right upper quadrant. She is treated herself with naproxen with partial relief On exam she appears in no distress lungs clear auscultation heart regular rate and rhythm abdomen nondistended normal active bowel sounds minimally tender over right upper quadrant without guarding rigidity or rigidity. Pretest clinical suspicion for pulmonary embolus in is low. Negative d-dimer. Chest x-ray viewed by me Heart score equals 1 based on only cardiac risk factor being ex-smoker   Doug SouSam Ziyonna Christner, MD 01/28/16 2346 12:20 AM patient resting comfortably. No distress. Plan referral [facility]. Diagnosis atypical chest pain Results for orders placed or performed during the hospital encounter of 01/28/16  Basic metabolic panel  Result Value Ref Range   Sodium 135 135 - 145 mmol/L   Potassium 4.2 3.5 - 5.1 mmol/L   Chloride 108 101 - 111 mmol/L   CO2 20 (L) 22 - 32 mmol/L   Glucose, Bld 86 65 - 99 mg/dL   BUN 14 6 - 20 mg/dL   Creatinine, Ser 1.610.65 0.44 - 1.00 mg/dL   Calcium 8.7 (L) 8.9 - 10.3 mg/dL   GFR calc non Af Amer >60 >60 mL/min   GFR calc Af Amer >60 >60 mL/min   Anion gap 7 5 - 15  CBC  Result Value Ref Range   WBC 7.1 4.0 - 10.5 K/uL   RBC 4.48 3.87 - 5.11 MIL/uL   Hemoglobin 12.9 12.0 - 15.0 g/dL   HCT 09.639.3 04.536.0 - 40.946.0 %   MCV 87.7 78.0 - 100.0 fL   MCH 28.8 26.0 - 34.0 pg   MCHC 32.8 30.0 - 36.0 g/dL   RDW 81.120.2 (H) 91.411.5 - 78.215.5 %   Platelets 280 150 - 400 K/uL  D-dimer, quantitative (not at [facility])  Result Value Ref Range   D-Dimer, Quant <0.27 0.00 - 0.50 ug/mL-FEU  Hepatic function panel  Result Value Ref Range   Total Protein 6.6 6.5 - 8.1 g/dL   Albumin 4.0 3.5 - 5.0 g/dL   AST 45 (H) 15 - 41 U/L   ALT 44 14 - 54 U/L   Alkaline Phosphatase 73 38 - 126 U/L   Total Bilirubin <0.1 (L)  0.3 - 1.2 mg/dL   Bilirubin, Direct <9.5<0.1 (L) 0.1 - 0.5 mg/dL   Indirect Bilirubin NOT CALCULATED 0.3 - 0.9 mg/dL  Lipase, blood  Result Value Ref Range   Lipase 14 11 - 51 U/L  I-stat troponin, ED  Result Value Ref Range   Troponin i, poc 0.00 0.00 - 0.08 ng/mL   Comment 3          [facility]-Stat Troponin, ED ([facility])  Result Value Ref Range   Troponin i, poc 0.00 0.00 - 0.08 ng/mL   Comment 3           Dg Chest 2 View  Result Date: 01/28/2016 CLINICAL DATA:  Chest pain radiating into the back for several weeks, worse today. Productive cough for 1 week. EXAM: CHEST  2 VIEW COMPARISON:  None. FINDINGS: Lungs are clear. Heart size is normal. No pneumothorax or pleural effusion. No bony abnormality. IMPRESSION: Negative chest Electronically Signed   By: Drusilla Kannerhomas  Dalessio M.D.   On: 01/28/2016 13:24     Doug SouSam Marcello Tuzzolino, MD 01/29/16 [id number]    Doug SouSam Jaquita Bessire, MD 01/29/16 [phone]

## 2016-01-28 NOTE — ED Notes (Addendum)
Pt requesting something for headache; RN notified

## 2016-01-28 NOTE — ED Provider Notes (Signed)
[facility]PT Provider Note   CSN: [id number] Arrival date & time: 01/28/16  1224     History   Chief Complaint Chief Complaint  Patient presents with  . Chest Pain    HPI Marisa Huaeisha L Mccaskey is a 36 y.o. female.  The history is provided by the patient.  Chest Pain   This is a new problem. The current episode started more than 1 week ago. The problem occurs constantly. The problem has been gradually worsening. The pain is associated with breathing. The pain is present in the lateral region. The pain is moderate. The quality of the pain is described as pleuritic and sharp. The pain radiates to the mid back. The symptoms are aggravated by deep breathing. Associated symptoms include cough, near-syncope and shortness of breath. Pertinent negatives include no abdominal pain, no back pain, no exertional chest pressure, no fever, no hemoptysis, no lower extremity edema, no nausea, no palpitations, no syncope, no vomiting and no weakness. Treatments tried: naprosyn. The treatment provided mild relief. Risk factors: surgery 4 weeks ago.  Pertinent negatives for past medical history include no CAD, no CHF, no diabetes, no hyperlipidemia, no hypertension and no seizures.    Past Medical History:  Diagnosis Date  . Anemia   . Blood transfusion without reported diagnosis   . Iron deficiency anemia, unspecified 08/05/2013  . 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 Unspecified vitamin D deficiency 08/05/2013    Patient Active Problem List   Diagnosis Date Noted  . Iron deficiency anemia, unspecified 08/05/2013  . 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 Unspecified vitamin D deficiency 08/05/2013    Past Surgical History:  Procedure Laterality Date  . ABDOMINAL HYSTERECTOMY    . APPENDECTOMY    . CHOLECYSTECTOMY    . GASTRIC BYPASS      OB History    Gravida Para Term Preterm AB Living   1             SAB TAB Ectopic Multiple Live Births                   Home Medications    Prior to  Admission medications   Medication Sig Start Date End Date Taking? Authorizing Provider  albuterol (PROVENTIL HFA;VENTOLIN HFA) 108 (90 BASE) MCG/ACT inhaler Inhale 2 puffs into the lungs every 4 (four) hours as needed for wheezing or shortness of breath (cough, shortness of breath or wheezing.). 11/18/13  Yes Emi Belfasteborah B Gessner, FNP  FLUoxetine (PROZAC) 20 MG capsule Take 20 mg by mouth daily.   Yes Historical Provider, MD  Multiple Vitamin (MULTIVITAMIN) capsule Take 1 capsule by mouth daily.   Yes Historical Provider, MD  naproxen (NAPROSYN) 500 MG tablet Take 1 tablet (500 mg total) by mouth 2 (two) times daily. 05/30/15  Yes Glynn OctaveStephen Rancour, MD  nortriptyline (PAMELOR) 25 MG capsule Take 50 mg by mouth at bedtime.    Yes Historical Provider, MD  topiramate (TOPAMAX) 100 MG tablet Take 200 mg by mouth 2 (two) times daily.   Yes Historical Provider, MD  Amphetamine-Dextroamphetamine (ADDERALL PO) Take by mouth.    Historical Provider, MD  Ferrous Sulfate (IRON) 325 (65 FE) MG TABS Take by mouth daily.    Historical Provider, MD  traMADol (ULTRAM) 50 MG tablet Take by mouth every 6 (six) hours as needed.    Historical Provider, MD    Family History No family history on file.  Social History Social History  Substance Use Topics  . Smoking status: Never Smoker  .  Smokeless tobacco: Never Used  . Alcohol use No     Allergies   Acetaminophen   Review of Systems Review of Systems  Constitutional: Negative for chills and fever.  HENT: Negative for ear pain and sore throat.   Eyes: Negative for pain and visual disturbance.  Respiratory: Positive for cough and shortness of breath. Negative for hemoptysis.   Cardiovascular: Positive for chest pain and near-syncope. Negative for palpitations and syncope.  Gastrointestinal: Negative for abdominal pain, nausea and vomiting.  Genitourinary: Negative for dysuria and hematuria.  Musculoskeletal: Negative for arthralgias and back pain.  Skin:  Negative for color change and rash.  Neurological: Negative for seizures, syncope and weakness.  All other systems reviewed and are negative.    Physical Exam Updated Vital Signs BP 110/72   Pulse 70   Temp 98.1 F (36.7 C)   Resp 16   LMP 07/22/2013   SpO2 100%   Physical Exam  Constitutional: She appears well-developed and well-nourished. No distress.  HENT:  Head: Normocephalic and atraumatic.  Eyes: Conjunctivae are normal.  Neck: Neck supple.  Cardiovascular: Normal rate and regular rhythm.   No murmur heard. Pulmonary/Chest: Effort normal and breath sounds normal. No respiratory distress. She exhibits no tenderness, no crepitus and no edema.  Abdominal: Soft. There is no tenderness.  Musculoskeletal: She exhibits no edema.  No LE edema, erythema or tenderness to palpation b/l  Neurological: She is alert.  Skin: Skin is warm and dry. Capillary refill takes less than 2 seconds.  Psychiatric: She has a normal mood and affect.  Nursing note and vitals reviewed.    ED Treatments / Results  Labs (all labs ordered are listed, but only abnormal results are displayed) Labs Reviewed  BASIC METABOLIC PANEL - Abnormal; Notable for the following:       Result Value   CO2 20 (*)    Calcium 8.7 (*)    All other components within normal limits  CBC - Abnormal; Notable for the following:    RDW 20.2 (*)    All other components within normal limits  HEPATIC FUNCTION PANEL - Abnormal; Notable for the following:    AST 45 (*)    Total Bilirubin <0.1 (*)    Bilirubin, Direct <0.1 (*)    All other components within normal limits  D-DIMER, QUANTITATIVE (NOT AT [facility])  LIPASE, BLOOD  I-STAT TROPOININ, ED  I-STAT TROPOININ, ED    EKG  EKG Interpretation  Date/Time:  Friday January 28 2016 12:33:37 EST Ventricular Rate:  103 PR Interval:  130 QRS Duration: 82 QT Interval:  358 QTC Calculation: 468 R Axis:   101 Text Interpretation:  Sinus tachycardia Right atrial  enlargement Rightward axis Pulmonary disease pattern Abnormal ECG No old tracing to compare Confirmed by Ethelda Chick  MD, SAM [phone]) on 01/28/2016 8:30:04 PM       Radiology Dg Chest 2 View  Result Date: 01/28/2016 CLINICAL DATA:  Chest pain radiating into the back for several weeks, worse today. Productive cough for 1 week. EXAM: CHEST  2 VIEW COMPARISON:  None. FINDINGS: Lungs are clear. Heart size is normal. No pneumothorax or pleural effusion. No bony abnormality. IMPRESSION: Negative chest Electronically Signed   By: Drusilla Kanner M.D.   On: 01/28/2016 13:24    Procedures Procedures (including critical care time)  Medications Ordered in ED Medications  ibuprofen (ADVIL,MOTRIN) tablet 600 mg (600 mg Oral Given 01/28/16 2226)     Initial Impression / Assessment and Plan / ED Course  I have reviewed the triage vital signs and the nursing notes.  Pertinent labs & imaging results that were available during my care of the patient were reviewed by me and considered in my medical decision making (see chart for details).  Clinical Course    36 year old female presenting with right lower chest pain is worse on inspiration and pleuritic. It has been worsening over the past 3 weeks. She had surgery on December 11 for scar tissue removal requiring general anesthesia. Denies any history of DVT or PE. Denies any fevers or infectious symptoms. Denies abdominal pain. CBC, BMP, troponin ,chest x-ray ordered in triage. CBC and BMP grossly unremarkable. Chest x-ray unremarkable. Troponin is negative. Due to concern for PE as the patient has pleuritic chest pain one month after surgery, d-dimer added as she is low well score.  3 hour troponin also negative. D-dimer negative. On reassessment patient states that she is very worried about her liver and wants her LFTs checked. She is now endorsing right upper quadrant pain she has mild tenderness to palpation in the right upper quadrant. Hepatic  function panel and lipase added. These labs are grossly unremarkable. Patient struck to take Tylenol and Motrin for pain for nonspecific chest pain and follow up with the PCP next available appointment. She was given information on how to find a new PCP as she states she does not like her current PCP. Patient is amenable with this plan and given strict return precautions  Final Clinical Impressions(s) / ED Diagnoses   Final diagnoses:  Atypical chest pain    New Prescriptions Discharge Medication List as of 01/29/2016 12:26 AM       Jermey Closs [place] Vesper Trant, MD 01/29/16 1610    Doug Sou, MD 01/29/16 1640

## 2016-01-28 NOTE — ED Notes (Signed)
Pt agreeable to being placed in hallway bed. Pt ambulatory to room with independent steady gait. Pt changed into a gown (has privacy fence in hallway) and placed on cardiac monitor.

## 2016-01-29 NOTE — ED Notes (Signed)
Patient Alert and oriented X4. Stable and ambulatory. Patient verbalized understanding of the discharge instructions.  Patient belongings were taken by the patient.  

## 2016-01-29 NOTE — Discharge Instructions (Signed)
[facility] or the 800 number on these discharge instructions to get a new Primary care physician. It is safe to take Tylenol or naproxen as directed as needed for pain

## 2016-03-01 ENCOUNTER — Other Ambulatory Visit (HOSPITAL_COMMUNITY): Payer: Self-pay | Admitting: Internal Medicine

## 2016-03-01 ENCOUNTER — Other Ambulatory Visit: Payer: Self-pay | Admitting: Internal Medicine

## 2016-03-01 DIAGNOSIS — R109 Unspecified abdominal pain: Principal | ICD-10-CM

## 2016-03-01 DIAGNOSIS — G8929 Other chronic pain: Secondary | ICD-10-CM

## 2016-03-06 ENCOUNTER — Other Ambulatory Visit: Payer: Self-pay | Admitting: Internal Medicine

## 2016-03-06 DIAGNOSIS — R0602 Shortness of breath: Secondary | ICD-10-CM

## 2016-03-07 ENCOUNTER — Other Ambulatory Visit: Payer: BLUE CROSS/BLUE SHIELD

## 2016-03-07 ENCOUNTER — Ambulatory Visit
Admission: RE | Admit: 2016-03-07 | Discharge: 2016-03-07 | Disposition: A | Discharge status: Home / Self Care | Payer: BLUE CROSS/BLUE SHIELD | Source: Ambulatory Visit | Attending: Internal Medicine | Admitting: Internal Medicine

## 2016-03-07 ENCOUNTER — Ambulatory Visit: Admission: RE | Admit: 2016-03-07 | Payer: BLUE CROSS/BLUE SHIELD | Source: Ambulatory Visit

## 2016-03-07 DIAGNOSIS — G8929 Other chronic pain: Secondary | ICD-10-CM

## 2016-03-07 DIAGNOSIS — R109 Unspecified abdominal pain: Principal | ICD-10-CM

## 2016-03-09 ENCOUNTER — Other Ambulatory Visit: Payer: BLUE CROSS/BLUE SHIELD

## 2016-03-25 ENCOUNTER — Encounter (HOSPITAL_BASED_OUTPATIENT_CLINIC_OR_DEPARTMENT_OTHER): Payer: Self-pay | Admitting: *Deleted

## 2016-03-25 ENCOUNTER — Emergency Department (HOSPITAL_BASED_OUTPATIENT_CLINIC_OR_DEPARTMENT_OTHER)
Admission: EM | Admit: 2016-03-25 | Discharge: 2016-03-25 | Disposition: A | Discharge status: Home / Self Care | Payer: BLUE CROSS/BLUE SHIELD | Attending: Emergency Medicine | Admitting: Emergency Medicine

## 2016-03-25 ENCOUNTER — Emergency Department (HOSPITAL_BASED_OUTPATIENT_CLINIC_OR_DEPARTMENT_OTHER): Payer: BLUE CROSS/BLUE SHIELD

## 2016-03-25 DIAGNOSIS — R0789 Other chest pain: Secondary | ICD-10-CM | POA: Diagnosis not present

## 2016-03-25 DIAGNOSIS — R55 Syncope and collapse: Secondary | ICD-10-CM | POA: Insufficient documentation

## 2016-03-25 DIAGNOSIS — Z79899 Other long term (current) drug therapy: Secondary | ICD-10-CM | POA: Diagnosis not present

## 2016-03-25 DIAGNOSIS — R072 Precordial pain: Secondary | ICD-10-CM | POA: Diagnosis present

## 2016-03-25 LAB — COMPREHENSIVE METABOLIC PANEL
ALBUMIN: 3.8 g/dL (ref 3.5–5.0)
ALK PHOS: 82 U/L (ref 38–126)
ALT: 61 U/L — AB (ref 14–54)
AST: 50 U/L — ABNORMAL HIGH (ref 15–41)
Anion gap: 4 — ABNORMAL LOW (ref 5–15)
BUN: 12 mg/dL (ref 6–20)
CALCIUM: 8.3 mg/dL — AB (ref 8.9–10.3)
CO2: 22 mmol/L (ref 22–32)
CREATININE: 0.63 mg/dL (ref 0.44–1.00)
Chloride: 110 mmol/L (ref 101–111)
GFR calc Af Amer: >60 mL/min (ref >60)
GFR calc non Af Amer: >60 mL/min (ref >60)
GLUCOSE: 87 mg/dL (ref 65–99)
Potassium: 3.8 mmol/L (ref 3.5–5.1)
SODIUM: 136 mmol/L (ref 135–145)
Total Bilirubin: 0.4 mg/dL (ref 0.3–1.2)
Total Protein: 6.7 g/dL (ref 6.5–8.1)

## 2016-03-25 LAB — URINALYSIS, ROUTINE W REFLEX MICROSCOPIC
BILIRUBIN URINE: NEGATIVE
Glucose, UA: NEGATIVE mg/dL
HGB URINE DIPSTICK: NEGATIVE
KETONES UR: NEGATIVE mg/dL
Leukocytes, UA: NEGATIVE
Nitrite: NEGATIVE
PH: 7.5 (ref 5.0–8.0)
Protein, ur: NEGATIVE mg/dL
SPECIFIC GRAVITY, URINE: 1.015 (ref 1.005–1.030)

## 2016-03-25 LAB — CBC WITH DIFFERENTIAL/PLATELET
BASOS PCT: 0 %
Basophils Absolute: 0 10*3/uL (ref 0.0–0.1)
EOS ABS: 0.2 10*3/uL (ref 0.0–0.7)
Eosinophils Relative: 3 %
HEMATOCRIT: 37.1 % (ref 36.0–46.0)
HEMOGLOBIN: 12.4 g/dL (ref 12.0–15.0)
LYMPHS ABS: 2 10*3/uL (ref 0.7–4.0)
Lymphocytes Relative: 33 %
MCH: 31.2 pg (ref 26.0–34.0)
MCHC: 33.4 g/dL (ref 30.0–36.0)
MCV: 93.5 fL (ref 78.0–100.0)
Monocytes Absolute: 0.6 10*3/uL (ref 0.1–1.0)
Monocytes Relative: 9 %
Neutro Abs: 3.2 10*3/uL (ref 1.7–7.7)
Neutrophils Relative %: 55 %
Platelets: 228 10*3/uL (ref 150–400)
RBC: 3.97 MIL/uL (ref 3.87–5.11)
RDW: 12.9 % (ref 11.5–15.5)
WBC: 5.9 10*3/uL (ref 4.0–10.5)

## 2016-03-25 LAB — TROPONIN I: Troponin I: <0.03 ng/mL (ref <0.03)

## 2016-03-25 NOTE — ED Triage Notes (Signed)
Patient states she has had intermittent chest tightness for the last month.  States last night the pain got worse, and was associated with diaphoresis and a syncopal episode this morning just pta.   States the tightness is worse with deep breathing.

## 2016-03-25 NOTE — ED Notes (Signed)
Patient transported to X-ray 

## 2016-03-25 NOTE — ED Notes (Signed)
BP not accurate; pt sts was moving cuff when it was taking BP

## 2016-03-25 NOTE — Discharge Instructions (Signed)
Return if any problems.

## 2016-03-26 NOTE — ED Provider Notes (Signed)
[facility]-EMERGENCY DEPT Provider Note   CSN: [id number] Arrival date & time: 03/25/16  4098     History   Chief Complaint Chief Complaint  Patient presents with  . Chest Pain    HPI Cindy Hodge is a 36 y.o. female.  The history is provided by the patient. No language interpreter was used.  Chest Pain   This is a new problem. The current episode started more than 2 days ago. The problem occurs constantly. The problem has not changed since onset.The pain is associated with breathing. The pain is present in the substernal region. The pain is moderate. The pain does not radiate. The symptoms are aggravated by deep breathing. She has tried nothing for the symptoms. The treatment provided no relief. There are no known risk factors.  Pertinent negatives for past medical history include no hyperhomocysteinemia, no hyperlipidemia and no hypertension.  Pt has a history of anemia.  Pt reports she felt weak and almost passed out.   Past Medical History:  Diagnosis Date  . Anemia   . Blood transfusion without reported diagnosis   . Iron deficiency anemia, unspecified 08/05/2013  . 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 Unspecified vitamin D deficiency 08/05/2013    Patient Active Problem List   Diagnosis Date Noted  . Iron deficiency anemia, unspecified 08/05/2013  . 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 Unspecified vitamin D deficiency 08/05/2013    Past Surgical History:  Procedure Laterality Date  . ABDOMINAL HYSTERECTOMY    . APPENDECTOMY    . CHOLECYSTECTOMY    . GASTRIC BYPASS      OB History    Gravida Para Term Preterm AB Living   1             SAB TAB Ectopic Multiple Live Births                   Home Medications    Prior to Admission medications   Medication Sig Start Date End Date Taking? Authorizing Provider  Ferrous Sulfate (IRON) 325 (65 FE) MG TABS Take by mouth daily.   Yes Historical Provider, MD  FLUoxetine (PROZAC) 20 MG capsule Take 20 mg by mouth daily.   Yes  Historical Provider, MD  Multiple Vitamin (MULTIVITAMIN) capsule Take 1 capsule by mouth daily.   Yes Historical Provider, MD  nortriptyline (PAMELOR) 25 MG capsule Take 50 mg by mouth at bedtime.    Yes Historical Provider, MD  topiramate (TOPAMAX) 100 MG tablet Take 200 mg by mouth 2 (two) times daily.   Yes Historical Provider, MD  albuterol (PROVENTIL HFA;VENTOLIN HFA) 108 (90 BASE) MCG/ACT inhaler Inhale 2 puffs into the lungs every 4 (four) hours as needed for wheezing or shortness of breath (cough, shortness of breath or wheezing.). 11/18/13   Emi Belfast, FNP  Amphetamine-Dextroamphetamine (ADDERALL PO) Take by mouth.    Historical Provider, MD  naproxen (NAPROSYN) 500 MG tablet Take 1 tablet (500 mg total) by mouth 2 (two) times daily. 05/30/15   Glynn Octave, MD  traMADol (ULTRAM) 50 MG tablet Take by mouth every 6 (six) hours as needed.    Historical Provider, MD    Family History No family history on file.  Social History Social History  Substance Use Topics  . Smoking status: Never Smoker  . Smokeless tobacco: Never Used  . Alcohol use No     Allergies   Acetaminophen   Review of Systems Review of Systems  Cardiovascular: Positive for chest pain.  All  other systems reviewed and are negative.    Physical Exam Updated Vital Signs BP 116/78   Pulse 73   Temp 97.9 F (36.6 C) (Oral)   Resp 14   LMP 07/22/2013   SpO2 100%   Physical Exam  Constitutional: She is oriented to person, place, and time. She appears well-developed and well-nourished.  HENT:  Head: Normocephalic and atraumatic.  Eyes: Conjunctivae and EOM are normal. Pupils are equal, round, and reactive to light.  Neck: Normal range of motion.  Cardiovascular: Normal rate and regular rhythm.   Pulmonary/Chest: Effort normal.  Abdominal: Soft.  Musculoskeletal: Normal range of motion.  Neurological: She is alert and oriented to person, place, and time.  Skin: Skin is warm.  Psychiatric:  She has a normal mood and affect.  Nursing note and vitals reviewed.    ED Treatments / Results  Labs (all labs ordered are listed, but only abnormal results are displayed) Labs Reviewed  COMPREHENSIVE METABOLIC PANEL - Abnormal; Notable for the following:       Result Value   Calcium 8.3 (*)    AST 50 (*)    ALT 61 (*)    Anion gap 4 (*)    All other components within normal limits  CBC WITH DIFFERENTIAL/PLATELET  TROPONIN I  URINALYSIS, ROUTINE W REFLEX MICROSCOPIC    EKG  EKG Interpretation  Date/Time:  Saturday March 25 2016 08:54:36 EST Ventricular Rate:  86 PR Interval:    QRS Duration: 96 QT Interval:  368 QTC Calculation: 441 R Axis:   86 Text Interpretation:  Sinus rhythm Atrial premature complex no significant change since Jan 2018 Confirmed by Criss AlvineGOLDSTON MD, SCOTT [phone]([id number]) on 03/25/2016 9:00:59 AM       Radiology Dg Chest 2 View  Result Date: 03/25/2016 CLINICAL DATA:  Chest pain for 1 month EXAM: CHEST  2 VIEW COMPARISON:  01/28/2016 FINDINGS: The heart size and mediastinal contours are within normal limits. Both lungs are clear. The visualized skeletal structures are unremarkable. Nipple shadows are again noted bilaterally. IMPRESSION: No active cardiopulmonary disease. Electronically Signed   By: Alcide CleverMark  Lukens M.D.   On: 03/25/2016 09:27    Procedures Procedures (including critical care time)  Medications Ordered in ED Medications - No data to display   Initial Impression / Assessment and Plan / ED Course  I have reviewed the triage vital signs and the nursing notes.  Pertinent labs & imaging results that were available during my care of the patient were reviewed by me and considered in my medical decision making (see chart for details).     Labs normal. Chest xray and ekg normal   Final Clinical Impressions(s) / ED Diagnoses   Final diagnoses:  Chest wall pain  Near syncope    New Prescriptions Discharge Medication List as of 03/25/2016 11:42  AM    An After Visit Summary was printed and given to the patient. No orders of the defined types were placed in this encounter.    Lonia SkinnerLeslie K SheridanSofia, PA-C 03/26/16 1110    Pricilla LovelessScott Goldston, MD 03/29/16 [phone]

## 2018-07-10 ENCOUNTER — Other Ambulatory Visit: Payer: Self-pay

## 2020-01-10 ENCOUNTER — Encounter (HOSPITAL_COMMUNITY): Payer: Self-pay

## 2020-01-10 ENCOUNTER — Emergency Department (HOSPITAL_COMMUNITY)
Admission: EM | Admit: 2020-01-10 | Discharge: 2020-01-10 | Disposition: A | Discharge status: Home / Self Care | Payer: BLUE CROSS/BLUE SHIELD | Attending: Emergency Medicine | Admitting: Emergency Medicine

## 2020-01-10 DIAGNOSIS — R21 Rash and other nonspecific skin eruption: Secondary | ICD-10-CM | POA: Insufficient documentation

## 2020-01-10 MED ORDER — HYDROXYZINE HCL 25 MG PO TABS
25.0000 mg | ORAL_TABLET | Freq: Once | ORAL | Status: AC
  Administered 2020-01-10: 20:00:00 25 mg via ORAL
  Filled 2020-01-10: qty 1

## 2020-01-10 MED ORDER — DEXAMETHASONE 2 MG PO TABS: 10.0000 mg | ORAL_TABLET | Freq: Once | ORAL | 0 refills | Status: AC

## 2020-01-10 MED ORDER — PERMETHRIN 5 % EX CREA: TOPICAL_CREAM | CUTANEOUS | 1 refills | Status: AC

## 2020-01-10 MED ORDER — HYDROXYZINE HCL 25 MG PO TABS: 25.0000 mg | ORAL_TABLET | Freq: Four times a day (QID) | ORAL | 0 refills | Status: AC

## 2020-01-10 MED ORDER — DEXAMETHASONE 4 MG PO TABS
10.0000 mg | ORAL_TABLET | Freq: Once | ORAL | Status: AC
Start: 2020-01-10 — End: 2020-01-10
  Administered 2020-01-10: 20:00:00 10 mg via ORAL
  Filled 2020-01-10: qty 3

## 2020-01-10 NOTE — ED Notes (Signed)
Pt very upset, pt states she feels like she is not being taken seriously regarding possibility of internal/external infestation, pt reports she has been awake most of night with parasites coming out of her eyes and ears. Pt states she "digs" at areas where she knows a cluster of "eggs" is buried in her skin. Pt tearful stating she used creams in the past without succuss. Pt encouraged to use medications for inflammatory process as well as itching, pt verbalized understanding of follow up with PCP and possibility of specialist involvement as well as prescriptions.

## 2020-01-10 NOTE — ED Triage Notes (Signed)
Pt from a motel with ems, rash all over, diagnosed with scabies a month ago.

## 2020-01-10 NOTE — ED Provider Notes (Signed)
[facility] EMERGENCY DEPARTMENT Provider Note   CSN: [id number] Arrival date & time: 01/10/20  1836     History Chief Complaint  Patient presents with   Rash    Cindy Hodge is a 39 y.o. female.  39 yo F with a chief complaints of a diffuse rash.  Patient tells me that she feels like insects are crawling out of her ears and out of her eyeballs.  Is been an ongoing issues she has seen her family doctor for this and been prescribed different medications to try and stop the parasitosis.  Nothing seems to be working.  She has been bathing and vinegar and laying in baths which seems to help her the most.  She feels like bugs crawling out of her skin and then she is unable to capture them and then they go back into where they were hiding.  No fevers.  She feels like she must have an infection in her blood because she feels pain all over and she is not having any significant improvement.  The history is provided by the patient.  Rash Location:  Full body Quality: itchiness and painful   Pain details:    Quality:  Shooting   Severity:  Moderate   Onset quality:  Gradual   Duration:  2 days   Timing:  Constant   Progression:  Worsening Severity:  Mild Onset quality:  Sudden Duration:  2 days Timing:  Constant Progression:  Worsening Chronicity:  New Relieved by:  Nothing Worsened by:  Nothing Ineffective treatments:  None tried Associated symptoms: no fever, no headaches, no joint pain, no myalgias, no nausea, no shortness of breath, not vomiting and not wheezing        Past Medical History:  Diagnosis Date   Anemia    Blood transfusion without reported diagnosis    Iron deficiency anemia, unspecified 08/05/2013   S/P gastric bypass 08/05/2013   Unspecified vitamin D deficiency 08/05/2013    Patient Active Problem List   Diagnosis Date Noted   Iron deficiency anemia, unspecified 08/05/2013   S/P gastric bypass 08/05/2013   Unspecified  vitamin D deficiency 08/05/2013    Past Surgical History:  Procedure Laterality Date   ABDOMINAL HYSTERECTOMY     APPENDECTOMY     CHOLECYSTECTOMY     GASTRIC BYPASS       OB History    Gravida  1   Para      Term      Preterm      AB      Living        SAB      IAB      Ectopic      Multiple      Live Births              No family history on file.  Social History   Tobacco Use   Smoking status: Never Smoker   Smokeless tobacco: Never Used  Substance Use Topics   Alcohol use: No   Drug use: No    Home Medications Prior to Admission medications   Medication Sig Start Date End Date Taking? Authorizing Provider  albuterol (PROVENTIL HFA;VENTOLIN HFA) 108 (90 BASE) MCG/ACT inhaler Inhale 2 puffs into the lungs every 4 (four) hours as needed for wheezing or shortness of breath (cough, shortness of breath or wheezing.). 11/18/13   Emi Belfast, FNP  Amphetamine-Dextroamphetamine (ADDERALL PO) Take by mouth.    [provider]  dexamethasone (DECADRON) 2 MG tablet Take 5 tablets (10 mg total) by mouth once for 1 dose. Two days from now 01/10/20 01/10/20  Melene Plan, DO  Ferrous Sulfate (IRON) 325 (65 FE) MG TABS Take by mouth daily.    [provider]  FLUoxetine (PROZAC) 20 MG capsule Take 20 mg by mouth daily.    [provider]  hydrOXYzine (ATARAX/VISTARIL) 25 MG tablet Take 1 tablet (25 mg total) by mouth every 6 (six) hours. 01/10/20   Melene Plan, DO  Multiple Vitamin (MULTIVITAMIN) capsule Take 1 capsule by mouth daily.    [provider]  naproxen (NAPROSYN) 500 MG tablet Take 1 tablet (500 mg total) by mouth 2 (two) times daily. 05/30/15   Rancour, Jeannett Senior, MD  nortriptyline (PAMELOR) 25 MG capsule Take 50 mg by mouth at bedtime.     [provider]  permethrin (ELIMITE) 5 % cream Apply to affected area once 01/10/20   Melene Plan, DO  topiramate (TOPAMAX) 100 MG tablet Take 200 mg by mouth 2  (two) times daily.    [provider]  traMADol (ULTRAM) 50 MG tablet Take by mouth every 6 (six) hours as needed.    [provider]    Allergies    Acetaminophen  Review of Systems   Review of Systems  Constitutional: Negative for chills and fever.  HENT: Negative for congestion and rhinorrhea.   Eyes: Negative for redness and visual disturbance.  Respiratory: Negative for shortness of breath and wheezing.   Cardiovascular: Negative for chest pain and palpitations.  Gastrointestinal: Negative for nausea and vomiting.  Genitourinary: Negative for dysuria and urgency.  Musculoskeletal: Negative for arthralgias and myalgias.  Skin: Positive for rash. Negative for pallor and wound.  Neurological: Negative for dizziness and headaches.    Physical Exam Updated Vital Signs BP 130/78 (BP Location: Right Arm)    Pulse 95    Temp 98.1 F (36.7 C)    Resp 16    LMP 07/22/2013    SpO2 99%   Physical Exam Vitals and nursing note reviewed.  Constitutional:      General: She is not in acute distress.    Appearance: She is well-developed and well-nourished. She is not diaphoretic.  HENT:     Head: Normocephalic and atraumatic.  Eyes:     Extraocular Movements: EOM normal.     Pupils: Pupils are equal, round, and reactive to light.  Cardiovascular:     Rate and Rhythm: Normal rate and regular rhythm.     Heart sounds: No murmur heard. No friction rub. No gallop.   Pulmonary:     Effort: Pulmonary effort is normal.     Breath sounds: No wheezing or rales.  Abdominal:     General: There is no distension.     Palpations: Abdomen is soft.     Tenderness: There is no abdominal tenderness.  Musculoskeletal:        General: No tenderness or edema.     Cervical back: Normal range of motion and neck supple.  Skin:    General: Skin is warm and dry.     Comments: Diffuse erythematous rash with excoriations.  Signs of picking.  No insects were observed, not in the  conjunctive a or in the ears.  Neurological:     Mental Status: She is alert and oriented to person, place, and time.  Psychiatric:        Mood and Affect: Mood and affect normal.  Behavior: Behavior normal.     ED Results / Procedures / Treatments   Labs (all labs ordered are listed, but only abnormal results are displayed) Labs Reviewed - No data to display  EKG None  Radiology No results found.  Procedures Procedures (including critical care time)  Medications Ordered in ED Medications  dexamethasone (DECADRON) tablet 10 mg (10 mg Oral Given 01/10/20 1947)  hydrOXYzine (ATARAX/VISTARIL) tablet 25 mg (25 mg Oral Given 01/10/20 1948)    ED Course  I have reviewed the triage vital signs and the nursing notes.  Pertinent labs & imaging results that were available during my care of the patient were reviewed by me and considered in my medical decision making (see chart for details).    MDM Rules/Calculators/A&P                          39 yo F with a cc of diffuse rash.  Clinically it seems more likely to be delusional as she is complaining of insects coming out of different parts of her body and going out of her ears and out of her eyes.  Signs of picking.  On the off chance this is continued parasitosis will represcribe permethrin.  Have her follow-up with her family doctor in the office.  Dose of steroids.  7:57 PM:  I have discussed the diagnosis/risks/treatment options with the patient and believe the pt to be eligible for discharge home to follow-up with PCP. We also discussed returning to the ED immediately if new or worsening sx occur. We discussed the sx which are most concerning (e.g., sudden worsening pain, fever, inability to tolerate by mouth) that necessitate immediate return. Medications administered to the patient during their visit and any new prescriptions provided to the patient are listed below.  Medications given during this visit Medications   dexamethasone (DECADRON) tablet 10 mg (10 mg Oral Given 01/10/20 1947)  hydrOXYzine (ATARAX/VISTARIL) tablet 25 mg (25 mg Oral Given 01/10/20 1948)     The patient appears reasonably screen and/or stabilized for discharge and I doubt any other medical condition or other [facility] requiring further screening, evaluation, or treatment in the ED at this time prior to discharge.   Final Clinical Impression(s) / ED Diagnoses Final diagnoses:  Rash and nonspecific skin eruption    Rx / DC Orders ED Discharge Orders         Ordered    permethrin (ELIMITE) 5 % cream        01/10/20 1939    dexamethasone (DECADRON) 2 MG tablet   Once        01/10/20 1939    hydrOXYzine (ATARAX/VISTARIL) 25 MG tablet  Every 6 hours        01/10/20 1939           Melene Plan, DO 01/10/20 [phone]

## 2020-01-10 NOTE — Discharge Instructions (Addendum)
I prescribed you a medication that typically kills insects.  If this is the cause of your symptoms and it should help.  It is important to make sure that all of your clothing and linens are washed with very hot water and what cannot be washed his bags in an airtight container.  Please follow-up with your family doctor as this has not improved and worsened this may need follow-up with a specialist.

## 2020-08-13 ENCOUNTER — Emergency Department (HOSPITAL_COMMUNITY): Payer: Medicaid Other

## 2020-08-13 ENCOUNTER — Emergency Department (HOSPITAL_COMMUNITY)
Admission: EM | Admit: 2020-08-13 | Discharge: 2020-08-13 | Disposition: A | Discharge status: Home / Self Care | Payer: Medicaid Other | Attending: Emergency Medicine | Admitting: Emergency Medicine

## 2020-08-13 ENCOUNTER — Other Ambulatory Visit: Payer: Self-pay

## 2020-08-13 DIAGNOSIS — S0003XA Contusion of scalp, initial encounter: Secondary | ICD-10-CM | POA: Insufficient documentation

## 2020-08-13 DIAGNOSIS — Z5321 Procedure and treatment not carried out due to patient leaving prior to being seen by health care provider: Secondary | ICD-10-CM | POA: Insufficient documentation

## 2020-08-13 MED ORDER — OXYCODONE HCL 5 MG PO TABS
10.0000 mg | ORAL_TABLET | Freq: Once | ORAL | Status: AC
  Administered 2020-08-13: 10 mg via ORAL
  Filled 2020-08-13: qty 2
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
